# Patient Record
Sex: Male | Born: 1940
Health system: Southern US, Community
[De-identification: ages and names within clinical notes are randomized; demographics above are authoritative.]

## PROBLEM LIST (undated history)

## (undated) DIAGNOSIS — E7800 Pure hypercholesterolemia, unspecified: Secondary | ICD-10-CM

## (undated) DIAGNOSIS — K219 Gastro-esophageal reflux disease without esophagitis: Secondary | ICD-10-CM

## (undated) DIAGNOSIS — E079 Disorder of thyroid, unspecified: Secondary | ICD-10-CM

## (undated) DIAGNOSIS — N200 Calculus of kidney: Secondary | ICD-10-CM

## (undated) DIAGNOSIS — E039 Hypothyroidism, unspecified: Secondary | ICD-10-CM

## (undated) DIAGNOSIS — I2510 Atherosclerotic heart disease of native coronary artery without angina pectoris: Secondary | ICD-10-CM

## (undated) DIAGNOSIS — I219 Acute myocardial infarction, unspecified: Secondary | ICD-10-CM

## (undated) DIAGNOSIS — N400 Enlarged prostate without lower urinary tract symptoms: Secondary | ICD-10-CM

## (undated) HISTORY — PX: HERNIA REPAIR: SHX51

---

## 2016-07-26 ENCOUNTER — Emergency Department
Admission: EM | Admit: 2016-07-26 | Discharge: 2016-07-27 | Disposition: A | Discharge status: Other Acute Inpatient Hospital | Payer: Medicare PPO | Attending: Emergency Medicine | Admitting: Emergency Medicine

## 2016-07-26 DIAGNOSIS — F600 Paranoid personality disorder: Secondary | ICD-10-CM | POA: Insufficient documentation

## 2016-07-26 DIAGNOSIS — F22 Delusional disorders: Secondary | ICD-10-CM

## 2016-07-26 LAB — URINE DRUG SCREEN, QUALITATIVE (ARMC ONLY)
AMPHETAMINES, UR SCREEN: NOT DETECTED
BENZODIAZEPINE, UR SCRN: NOT DETECTED
Barbiturates, Ur Screen: NOT DETECTED
COCAINE METABOLITE, UR ~~LOC~~: NOT DETECTED
Cannabinoid 50 Ng, Ur ~~LOC~~: NOT DETECTED
MDMA (ECSTASY) UR SCREEN: NOT DETECTED
Methadone Scn, Ur: NOT DETECTED
OPIATE, UR SCREEN: NOT DETECTED
PHENCYCLIDINE (PCP) UR S: NOT DETECTED
Tricyclic, Ur Screen: POSITIVE — AB

## 2016-07-26 LAB — URINALYSIS, COMPLETE (UACMP) WITH MICROSCOPIC
Bacteria, UA: NONE SEEN
Bilirubin Urine: NEGATIVE
Glucose, UA: NEGATIVE mg/dL
KETONES UR: NEGATIVE mg/dL
Nitrite: NEGATIVE
PH: 6 (ref 5.0–8.0)
Protein, ur: NEGATIVE mg/dL
Specific Gravity, Urine: 1.009 (ref 1.005–1.030)

## 2016-07-26 LAB — COMPREHENSIVE METABOLIC PANEL
ALT: 15 U/L — ABNORMAL LOW (ref 17–63)
AST: 23 U/L (ref 15–41)
Albumin: 3.8 g/dL (ref 3.5–5.0)
Alkaline Phosphatase: 36 U/L — ABNORMAL LOW (ref 38–126)
Anion gap: 5 (ref 5–15)
BUN: 19 mg/dL (ref 6–20)
CHLORIDE: 108 mmol/L (ref 101–111)
CO2: 24 mmol/L (ref 22–32)
CREATININE: 1.25 mg/dL — AB (ref 0.61–1.24)
Calcium: 8.8 mg/dL — ABNORMAL LOW (ref 8.9–10.3)
GFR, EST NON AFRICAN AMERICAN: 55 mL/min — AB (ref >60)
Glucose, Bld: 98 mg/dL (ref 65–99)
POTASSIUM: 3.9 mmol/L (ref 3.5–5.1)
Sodium: 137 mmol/L (ref 135–145)
Total Bilirubin: 0.6 mg/dL (ref 0.3–1.2)
Total Protein: 7.2 g/dL (ref 6.5–8.1)

## 2016-07-26 LAB — CBC
HCT: 44.3 % (ref 40.0–52.0)
Hemoglobin: 15 g/dL (ref 13.0–18.0)
MCH: 30.8 pg (ref 26.0–34.0)
MCHC: 33.9 g/dL (ref 32.0–36.0)
MCV: 90.6 fL (ref 80.0–100.0)
PLATELETS: 212 10*3/uL (ref 150–440)
RBC: 4.88 MIL/uL (ref 4.40–5.90)
RDW: 13.8 % (ref 11.5–14.5)
WBC: 7.4 10*3/uL (ref 3.8–10.6)

## 2016-07-26 LAB — TSH: TSH: 5.352 u[IU]/mL — ABNORMAL HIGH (ref 0.350–4.500)

## 2016-07-26 LAB — ETHANOL

## 2016-07-26 LAB — T4, FREE: Free T4: 1.02 ng/dL (ref 0.61–1.12)

## 2016-07-26 MED ORDER — FINASTERIDE 5 MG PO TABS
5.0000 mg | ORAL_TABLET | Freq: Every day | ORAL | Status: DC
  Administered 2016-07-26 – 2016-07-27 (×2): 5 mg via ORAL
  Filled 2016-07-26 (×2): qty 1

## 2016-07-26 MED ORDER — ATORVASTATIN CALCIUM 20 MG PO TABS
40.0000 mg | ORAL_TABLET | Freq: Every day | ORAL | Status: DC
  Administered 2016-07-26: 40 mg via ORAL
  Filled 2016-07-26: qty 2

## 2016-07-26 MED ORDER — RISPERIDONE 1 MG PO TABS
2.0000 mg | ORAL_TABLET | Freq: Every day | ORAL | Status: DC
  Administered 2016-07-26: 2 mg via ORAL
  Filled 2016-07-26: qty 2

## 2016-07-26 MED ORDER — QUETIAPINE FUMARATE 25 MG PO TABS
ORAL_TABLET | ORAL | Status: AC
  Filled 2016-07-26: qty 2

## 2016-07-26 MED ORDER — CLONAZEPAM 0.5 MG PO TABS
0.5000 mg | ORAL_TABLET | Freq: Once | ORAL | Status: AC
  Administered 2016-07-26: 0.5 mg via ORAL

## 2016-07-26 MED ORDER — CLONAZEPAM 0.5 MG PO TABS: 0.5000 mg | ORAL_TABLET | Freq: Every day | ORAL | Status: DC

## 2016-07-26 MED ORDER — DIVALPROEX SODIUM 500 MG PO DR TAB
500.0000 mg | DELAYED_RELEASE_TABLET | Freq: Two times a day (BID) | ORAL | Status: DC
  Administered 2016-07-26 – 2016-07-27 (×3): 500 mg via ORAL
  Filled 2016-07-26: qty 2
  Filled 2016-07-26 (×2): qty 1

## 2016-07-26 MED ORDER — PANTOPRAZOLE SODIUM 40 MG PO TBEC
40.0000 mg | DELAYED_RELEASE_TABLET | Freq: Every day | ORAL | Status: DC
  Administered 2016-07-26 – 2016-07-27 (×2): 40 mg via ORAL
  Filled 2016-07-26 (×2): qty 1

## 2016-07-26 MED ORDER — QUETIAPINE FUMARATE 25 MG PO TABS: 50.0000 mg | ORAL_TABLET | Freq: Every day | ORAL | Status: DC

## 2016-07-26 MED ORDER — RISPERIDONE 1 MG PO TABS
1.0000 mg | ORAL_TABLET | Freq: Every day | ORAL | Status: DC
  Administered 2016-07-26 – 2016-07-27 (×2): 1 mg via ORAL
  Filled 2016-07-26 (×2): qty 1

## 2016-07-26 MED ORDER — ATORVASTATIN CALCIUM 20 MG PO TABS
ORAL_TABLET | ORAL | Status: AC
  Administered 2016-07-26: 40 mg via ORAL
  Filled 2016-07-26: qty 2

## 2016-07-26 MED ORDER — CLONAZEPAM 0.5 MG PO TABS
ORAL_TABLET | ORAL | Status: AC
  Filled 2016-07-26: qty 1

## 2016-07-26 MED ORDER — LEVOTHYROXINE SODIUM 112 MCG PO TABS
112.0000 ug | ORAL_TABLET | Freq: Every day | ORAL | Status: DC
  Administered 2016-07-26 – 2016-07-27 (×2): 112 ug via ORAL
  Filled 2016-07-26 (×2): qty 1

## 2016-07-26 MED ORDER — ZIPRASIDONE MESYLATE 20 MG IM SOLR
10.0000 mg | Freq: Three times a day (TID) | INTRAMUSCULAR | Status: DC | PRN
  Administered 2016-07-26: 10 mg via INTRAMUSCULAR
  Filled 2016-07-26: qty 20

## 2016-07-26 MED ORDER — CLONAZEPAM 0.5 MG PO TABS
0.5000 mg | ORAL_TABLET | Freq: Two times a day (BID) | ORAL | Status: DC | PRN
  Administered 2016-07-27 (×2): 0.5 mg via ORAL
  Filled 2016-07-26 (×2): qty 1

## 2016-07-26 NOTE — ED Notes (Addendum)
Patient in room laying down ,but no asleep

## 2016-07-26 NOTE — ED Notes (Signed)
Patient in bathroom

## 2016-07-26 NOTE — ED Notes (Signed)

## 2016-07-26 NOTE — ED Triage Notes (Signed)
Per daughter patient has had odd behavior and paranoia since last week. Hx of the same last year and was admitted to psychiatric facility for the same. Does have a hx of bipolar disorder, also has not slept in days.

## 2016-07-26 NOTE — ED Notes (Signed)
Breakfast was given to patient. 

## 2016-07-26 NOTE — ED Notes (Signed)
Referral information for Psychiatric Hospitalization faxed to;     [facility] [phone]),    [facility] [phone]),    [facility] [phone])   [facility] [phone]),    [facility] [phone] or [phone]),    [facility] [phone]),    [facility] [phone]).

## 2016-07-26 NOTE — ED Notes (Signed)
Contact daughter Vedia Pereyra at cell [phone], work phone [phone] (work u for update or questions. Vedia Pereyra husband Trudee Grip at [phone] Ex-wife Marshia Ly [phone]
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
## 2016-07-26 NOTE — ED Notes (Signed)
BEHAVIORAL HEALTH ROUNDING Patient sleeping: No. Patient alert and oriented: yes Behavior appropriate:  - he has been up and down from the bed - back and forth to the BR - he cannot seem to settle down and relax .  ; If no, describe:  Nutrition and fluids offered: yes Toileting and hygiene offered: Yes  Sitter present: q15 minute observations and security  monitoring Law enforcement present: Yes  ODS

## 2016-07-26 NOTE — ED Notes (Signed)
Patient walking around in room.

## 2016-07-26 NOTE — ED Notes (Signed)
BEHAVIORAL HEALTH ROUNDING Patient sleeping: No. Patient alert and oriented: yes Behavior appropriate: Yes.  ; If no, describe:  Nutrition and fluids offered: yes Toileting and hygiene offered: Yes  Sitter present: q15 minute observations and security  monitoring Law enforcement present: Yes  ODS  

## 2016-07-26 NOTE — ED Notes (Signed)
Pt given meal tray and juice. 

## 2016-07-26 NOTE — ED Notes (Signed)
PO intake encouraged - breakfast provided along with an extra juice and 249ml water

## 2016-07-26 NOTE — ED Notes (Signed)
Patient in watching tv

## 2016-07-26 NOTE — ED Notes (Signed)
SOC is currently being performed
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
## 2016-07-26 NOTE — ED Notes (Signed)
Patient up walking to bathroom with nurse Amy T.

## 2016-07-26 NOTE — ED Notes (Signed)
BEHAVIORAL HEALTH ROUNDING Patient sleeping: No. Patient alert and oriented: yes Behavior appropriate:   ; If no, describe:  Nutrition and fluids offered: yes Toileting and hygiene offered: Yes  Sitter present: q15 minute observations and security monitoring Law enforcement present: Yes  ODS

## 2016-07-26 NOTE — ED Notes (Signed)
Patient in bathroom at this time.

## 2016-07-26 NOTE — ED Notes (Signed)
Patient has been accepted to [facility]  Patient assigned to room - [facility] - To be determined at admissions on arrival Accepting physician is Dr. Jonelle Sports.  Call report to [phone].  Representative was Jocelyn Lamer.  ER Staff is aware of it [facility] ER Sect.; Dr. Joni Fears, ER MD & [place] [facility] Patient's Nurse)     Patient can arrive on 07/27/2016 after 11:00 am

## 2016-07-26 NOTE — ED Notes (Signed)
BEHAVIORAL HEALTH ROUNDING Patient sleeping: Yes.   Patient alert and oriented: eyes closed  Appears to be asleep Behavior appropriate: Yes.  ; If no, describe:  Nutrition and fluids offered: Yes  Toileting and hygiene offered: sleeping Sitter present: q 15 minute observations and security monitoring Law enforcement present: yes  ODS 

## 2016-07-27 NOTE — ED Notes (Signed)
Called for transport by [facility]  [phone]

## 2016-07-27 NOTE — ED Notes (Signed)
Assessment completed  - am meds to be administered as ordered  - I informed him of his pending transfer to [facility] - offered him the phone so that he may contact his family if he wished  - he declined at this time   I reassured him that I will keep him informed about his care    Continue to monitor

## 2016-07-27 NOTE — ED Notes (Signed)
PT sleeping at this time, pt lying on left side, audible snoring can be heard, pt in NAD at this time.

## 2016-07-27 NOTE — ED Provider Notes (Signed)
-----------------------------------------   11:04 AM on 07/27/2016 -----------------------------------------   Blood pressure 107/85, pulse 69, temperature 97.6 F (36.4 C), temperature source Oral, resp. rate 18, SpO2 96 %.  The patient had no acute events since last update.  Calm and cooperative at this time.  Disposition is pending Psychiatry/Behavioral Medicine team recommendations.     Orbie Pyo, MD 07/27/16 [phone]

## 2016-07-27 NOTE — ED Notes (Addendum)
Pt ambulated to BR without difficulty noted, pt returned to room and laid back in bed without distress.

## 2016-07-27 NOTE — ED Notes (Signed)
He is transferring with [employer] officer at this time for an arranged inpt admission to [facility]

## 2016-07-27 NOTE — ED Notes (Signed)

## 2016-07-27 NOTE — ED Notes (Signed)
Pt sleeping at this time, equal and unlabored rise and fall of chest noted.

## 2016-07-27 NOTE — ED Notes (Signed)
BEHAVIORAL HEALTH ROUNDING Patient sleeping: No. Patient alert and oriented: yes Behavior appropriate: Yes.  - talkative ; If no, describe:  Nutrition and fluids offered: yes Toileting and hygiene offered: Yes  Sitter present: q15 minute observations and security monitoring Law enforcement present: Yes  ODS

## 2016-07-27 NOTE — ED Notes (Signed)
Pt states to this RN he is having difficulty sleeping, pt notified that this RN will check with PRN orders for sleeping difficulty. Pt verbalized understanding of this.

## 2016-07-27 NOTE — ED Notes (Signed)
Pt lying on left side sleeping, audible snoring can be heard at this time.

## 2016-07-27 NOTE — ED Notes (Signed)
Pt observed with no unusual behavior  Appropriate to stimulation  No verbalized needs or concerns at this time  NAD assessed  He has ambulated to and from the BR during report  Continue to monitor

## 2016-07-27 NOTE — ED Notes (Signed)
Am meds administered as ordered.

## 2016-07-27 NOTE — ED Provider Notes (Signed)
-----------------------------------------   6:41 AM on 07/27/2016 -----------------------------------------   Blood pressure 130/78, pulse 70, temperature 97.9 F (36.6 C), temperature source Axillary, resp. rate 20, SpO2 96 %.  The patient had no acute events since last update.  Sleeping at this time.  Looks like patient was accepted to [facility] and will be transported after 11 AM this morning.   Paulette Blanch, MD 07/27/16 [phone]

## 2016-07-27 NOTE — ED Notes (Signed)
ED  Is the patient under IVC or is there intent for IVC: Yes.   Is the patient medically cleared: Yes.   Is there vacancy in the ED BHU: Yes.   Is the population mix appropriate for patient: Yes.   Is the patient awaiting placement in inpatient or outpatient setting:  Accepted to [facility]  Has the patient had a psychiatric consult: Yes.  SOC   Survey of unit performed for contraband, proper placement and condition of furniture, tampering with fixtures in bathroom, shower, and each patient room: Yes.  ; Findings:  APPEARANCE/BEHAVIOR Calm and cooperative NEURO ASSESSMENT Orientation: oriented x3   Hallucinations: No.None noted (Hallucinations) Speech: Normal Gait: normal RESPIRATORY ASSESSMENT Even  Unlabored respirations  CARDIOVASCULAR ASSESSMENT Pulses equal   regular rate  Skin warm and dry   GASTROINTESTINAL ASSESSMENT no GI complaint EXTREMITIES Full ROM  PLAN OF CARE Provide calm/safe environment. Vital signs assessed twice daily. ED BHU Assessment once each 12-hour shift. Collaborate with TTS daily or as condition indicates. Assure the ED provider has rounded once each shift. Provide and encourage hygiene. Provide redirection as needed. Assess for escalating behavior; address immediately and inform ED provider.  Assess family dynamic and appropriateness for visitation as needed: Yes.  ; If necessary, describe findings:  Educate the patient/family about BHU procedures/visitation: Yes.  ; If necessary, describe findings:

## 2016-07-30 DIAGNOSIS — F3110 Bipolar disorder, current episode manic without psychotic features, unspecified: Secondary | ICD-10-CM | POA: Diagnosis not present

## 2016-07-31 DIAGNOSIS — F3110 Bipolar disorder, current episode manic without psychotic features, unspecified: Secondary | ICD-10-CM | POA: Diagnosis not present

## 2016-08-01 DIAGNOSIS — F3110 Bipolar disorder, current episode manic without psychotic features, unspecified: Secondary | ICD-10-CM | POA: Diagnosis not present

## 2016-08-02 DIAGNOSIS — F3110 Bipolar disorder, current episode manic without psychotic features, unspecified: Secondary | ICD-10-CM | POA: Diagnosis not present

## 2016-08-03 DIAGNOSIS — F3110 Bipolar disorder, current episode manic without psychotic features, unspecified: Secondary | ICD-10-CM | POA: Diagnosis not present

## 2016-08-27 DIAGNOSIS — F39 Unspecified mood [affective] disorder: Secondary | ICD-10-CM | POA: Diagnosis not present

## 2016-08-27 DIAGNOSIS — F419 Anxiety disorder, unspecified: Secondary | ICD-10-CM | POA: Diagnosis not present

## 2016-09-24 DIAGNOSIS — F419 Anxiety disorder, unspecified: Secondary | ICD-10-CM | POA: Diagnosis not present

## 2016-09-29 DIAGNOSIS — I219 Acute myocardial infarction, unspecified: Secondary | ICD-10-CM

## 2016-09-29 HISTORY — DX: Acute myocardial infarction, unspecified: I21.9

## 2016-10-10 ENCOUNTER — Emergency Department: Payer: Medicare HMO

## 2016-10-10 ENCOUNTER — Inpatient Hospital Stay
Admission: EM | Admit: 2016-10-10 | Discharge: 2016-10-13 | DRG: 247 | Disposition: A | Discharge status: Home / Self Care | Payer: Medicare HMO | Attending: Internal Medicine | Admitting: Internal Medicine

## 2016-10-10 DIAGNOSIS — R110 Nausea: Secondary | ICD-10-CM | POA: Diagnosis not present

## 2016-10-10 DIAGNOSIS — I214 Non-ST elevation (NSTEMI) myocardial infarction: Principal | ICD-10-CM | POA: Diagnosis present

## 2016-10-10 DIAGNOSIS — R008 Other abnormalities of heart beat: Secondary | ICD-10-CM | POA: Diagnosis present

## 2016-10-10 DIAGNOSIS — Z79899 Other long term (current) drug therapy: Secondary | ICD-10-CM | POA: Diagnosis not present

## 2016-10-10 DIAGNOSIS — N182 Chronic kidney disease, stage 2 (mild): Secondary | ICD-10-CM | POA: Diagnosis not present

## 2016-10-10 DIAGNOSIS — Z955 Presence of coronary angioplasty implant and graft: Secondary | ICD-10-CM | POA: Diagnosis not present

## 2016-10-10 DIAGNOSIS — E0781 Sick-euthyroid syndrome: Secondary | ICD-10-CM | POA: Diagnosis present

## 2016-10-10 DIAGNOSIS — R748 Abnormal levels of other serum enzymes: Secondary | ICD-10-CM | POA: Diagnosis not present

## 2016-10-10 DIAGNOSIS — E039 Hypothyroidism, unspecified: Secondary | ICD-10-CM | POA: Diagnosis present

## 2016-10-10 DIAGNOSIS — N400 Enlarged prostate without lower urinary tract symptoms: Secondary | ICD-10-CM | POA: Diagnosis present

## 2016-10-10 DIAGNOSIS — N179 Acute kidney failure, unspecified: Secondary | ICD-10-CM | POA: Diagnosis present

## 2016-10-10 DIAGNOSIS — I6523 Occlusion and stenosis of bilateral carotid arteries: Secondary | ICD-10-CM | POA: Diagnosis not present

## 2016-10-10 DIAGNOSIS — I499 Cardiac arrhythmia, unspecified: Secondary | ICD-10-CM | POA: Diagnosis not present

## 2016-10-10 DIAGNOSIS — I248 Other forms of acute ischemic heart disease: Secondary | ICD-10-CM | POA: Diagnosis present

## 2016-10-10 DIAGNOSIS — I493 Ventricular premature depolarization: Secondary | ICD-10-CM | POA: Diagnosis present

## 2016-10-10 DIAGNOSIS — I4510 Unspecified right bundle-branch block: Secondary | ICD-10-CM | POA: Diagnosis present

## 2016-10-10 DIAGNOSIS — R0902 Hypoxemia: Secondary | ICD-10-CM | POA: Diagnosis present

## 2016-10-10 DIAGNOSIS — I5030 Unspecified diastolic (congestive) heart failure: Secondary | ICD-10-CM | POA: Diagnosis not present

## 2016-10-10 DIAGNOSIS — R55 Syncope and collapse: Secondary | ICD-10-CM | POA: Diagnosis present

## 2016-10-10 DIAGNOSIS — Z881 Allergy status to other antibiotic agents status: Secondary | ICD-10-CM

## 2016-10-10 DIAGNOSIS — E785 Hyperlipidemia, unspecified: Secondary | ICD-10-CM | POA: Diagnosis present

## 2016-10-10 DIAGNOSIS — I498 Other specified cardiac arrhythmias: Secondary | ICD-10-CM

## 2016-10-10 DIAGNOSIS — K219 Gastro-esophageal reflux disease without esophagitis: Secondary | ICD-10-CM | POA: Diagnosis present

## 2016-10-10 DIAGNOSIS — R42 Dizziness and giddiness: Secondary | ICD-10-CM | POA: Diagnosis not present

## 2016-10-10 DIAGNOSIS — R079 Chest pain, unspecified: Secondary | ICD-10-CM | POA: Diagnosis not present

## 2016-10-10 DIAGNOSIS — Z9861 Coronary angioplasty status: Secondary | ICD-10-CM | POA: Diagnosis not present

## 2016-10-10 HISTORY — DX: Disorder of thyroid, unspecified: E07.9

## 2016-10-10 HISTORY — DX: Pure hypercholesterolemia, unspecified: E78.00

## 2016-10-10 LAB — COMPREHENSIVE METABOLIC PANEL
ALT: 14 U/L — ABNORMAL LOW (ref 17–63)
AST: 21 U/L (ref 15–41)
Albumin: 3.8 g/dL (ref 3.5–5.0)
Alkaline Phosphatase: 45 U/L (ref 38–126)
Anion gap: 10 (ref 5–15)
BUN: 16 mg/dL (ref 6–20)
CO2: 24 mmol/L (ref 22–32)
Calcium: 8.8 mg/dL — ABNORMAL LOW (ref 8.9–10.3)
Chloride: 106 mmol/L (ref 101–111)
Creatinine, Ser: 1.53 mg/dL — ABNORMAL HIGH (ref 0.61–1.24)
GFR calc Af Amer: 49 mL/min — ABNORMAL LOW (ref >60)
GFR calc non Af Amer: 42 mL/min — ABNORMAL LOW (ref >60)
Glucose, Bld: 138 mg/dL — ABNORMAL HIGH (ref 65–99)
Potassium: 4 mmol/L (ref 3.5–5.1)
Sodium: 140 mmol/L (ref 135–145)
Total Bilirubin: 1.1 mg/dL (ref 0.3–1.2)
Total Protein: 7.2 g/dL (ref 6.5–8.1)

## 2016-10-10 LAB — CBC
HEMATOCRIT: 45 % (ref 40.0–52.0)
Hemoglobin: 15.1 g/dL (ref 13.0–18.0)
MCH: 30.9 pg (ref 26.0–34.0)
MCHC: 33.5 g/dL (ref 32.0–36.0)
MCV: 92.3 fL (ref 80.0–100.0)
Platelets: 109 10*3/uL — ABNORMAL LOW (ref 150–440)
RBC: 4.88 MIL/uL (ref 4.40–5.90)
RDW: 13.9 % (ref 11.5–14.5)
WBC: 8.5 10*3/uL (ref 3.8–10.6)

## 2016-10-10 LAB — TROPONIN I: Troponin I: 0.04 ng/mL (ref <0.03)

## 2016-10-10 MED ORDER — FINASTERIDE 5 MG PO TABS
5.0000 mg | ORAL_TABLET | Freq: Every day | ORAL | Status: DC
Start: 2016-10-11 — End: 2016-10-13
  Administered 2016-10-11 – 2016-10-13 (×2): 5 mg via ORAL
  Filled 2016-10-10 (×2): qty 1

## 2016-10-10 MED ORDER — ONDANSETRON HCL 4 MG/2ML IJ SOLN: 4.0000 mg | Freq: Four times a day (QID) | INTRAMUSCULAR | Status: DC | PRN

## 2016-10-10 MED ORDER — ATORVASTATIN CALCIUM 20 MG PO TABS
40.0000 mg | ORAL_TABLET | Freq: Every day | ORAL | Status: DC
  Administered 2016-10-11 – 2016-10-12 (×2): 40 mg via ORAL
  Filled 2016-10-10 (×2): qty 2

## 2016-10-10 MED ORDER — ENOXAPARIN SODIUM 40 MG/0.4ML ~~LOC~~ SOLN
40.0000 mg | SUBCUTANEOUS | Status: DC
  Administered 2016-10-11: 40 mg via SUBCUTANEOUS
  Filled 2016-10-10: qty 0.4

## 2016-10-10 MED ORDER — PANTOPRAZOLE SODIUM 40 MG PO TBEC
40.0000 mg | DELAYED_RELEASE_TABLET | Freq: Every day | ORAL | Status: DC
  Administered 2016-10-11 – 2016-10-13 (×3): 40 mg via ORAL
  Filled 2016-10-10 (×3): qty 1

## 2016-10-10 MED ORDER — ZOLPIDEM TARTRATE 5 MG PO TABS: 5.0000 mg | ORAL_TABLET | Freq: Every evening | ORAL | Status: DC | PRN

## 2016-10-10 MED ORDER — BISACODYL 5 MG PO TBEC: 5.0000 mg | DELAYED_RELEASE_TABLET | Freq: Every day | ORAL | Status: DC | PRN

## 2016-10-10 MED ORDER — SENNOSIDES-DOCUSATE SODIUM 8.6-50 MG PO TABS
1.0000 | ORAL_TABLET | Freq: Every evening | ORAL | Status: DC | PRN
  Administered 2016-10-12: 1 via ORAL
  Filled 2016-10-10: qty 1

## 2016-10-10 MED ORDER — SODIUM CHLORIDE 0.9% FLUSH
3.0000 mL | Freq: Two times a day (BID) | INTRAVENOUS | Status: DC
  Administered 2016-10-11 – 2016-10-13 (×5): 3 mL via INTRAVENOUS

## 2016-10-10 MED ORDER — QUETIAPINE FUMARATE 25 MG PO TABS
50.0000 mg | ORAL_TABLET | Freq: Every day | ORAL | Status: DC
  Administered 2016-10-11 – 2016-10-12 (×3): 50 mg via ORAL
  Filled 2016-10-10 (×3): qty 2

## 2016-10-10 MED ORDER — LEVOTHYROXINE SODIUM 112 MCG PO TABS
112.0000 ug | ORAL_TABLET | Freq: Every day | ORAL | Status: DC
  Administered 2016-10-11 – 2016-10-13 (×2): 112 ug via ORAL
  Filled 2016-10-10 (×3): qty 1

## 2016-10-10 MED ORDER — ALUM & MAG HYDROXIDE-SIMETH 200-200-20 MG/5ML PO SUSP: 30.0000 mL | Freq: Four times a day (QID) | ORAL | Status: DC | PRN

## 2016-10-10 MED ORDER — IPRATROPIUM-ALBUTEROL 0.5-2.5 (3) MG/3ML IN SOLN: 3.0000 mL | Freq: Four times a day (QID) | RESPIRATORY_TRACT | Status: DC | PRN

## 2016-10-10 MED ORDER — MAGNESIUM CITRATE PO SOLN
1.0000 | Freq: Once | ORAL | Status: DC | PRN
  Filled 2016-10-10: qty 296

## 2016-10-10 MED ORDER — ONDANSETRON HCL 4 MG PO TABS: 4.0000 mg | ORAL_TABLET | Freq: Four times a day (QID) | ORAL | Status: DC | PRN

## 2016-10-10 MED ORDER — SODIUM CHLORIDE 0.9 % IV SOLN
INTRAVENOUS | Status: DC
  Administered 2016-10-11 (×2): via INTRAVENOUS

## 2016-10-10 MED ORDER — HYDROCODONE-ACETAMINOPHEN 5-325 MG PO TABS: 1.0000 | ORAL_TABLET | ORAL | Status: DC | PRN

## 2016-10-10 MED ORDER — SODIUM CHLORIDE 0.9 % IV BOLUS (SEPSIS)
1000.0000 mL | Freq: Once | INTRAVENOUS | Status: AC
  Administered 2016-10-10: 1000 mL via INTRAVENOUS

## 2016-10-10 MED ORDER — ASPIRIN 81 MG PO CHEW
324.0000 mg | CHEWABLE_TABLET | Freq: Once | ORAL | Status: AC
  Administered 2016-10-10: 324 mg via ORAL
  Filled 2016-10-10: qty 4

## 2016-10-10 MED ORDER — ACETAMINOPHEN 650 MG RE SUPP: 650.0000 mg | Freq: Four times a day (QID) | RECTAL | Status: DC | PRN

## 2016-10-10 MED ORDER — ACETAMINOPHEN 325 MG PO TABS: 650.0000 mg | ORAL_TABLET | Freq: Four times a day (QID) | ORAL | Status: DC | PRN

## 2016-10-10 NOTE — ED Triage Notes (Signed)
Pt brought in via ems for c/o chest pain and nausea - pt states 45 min ago he started with nausea/weakness/and syncopal episode

## 2016-10-10 NOTE — ED Notes (Signed)
Pt cont. To deny chest pain, shortness of breath, or nausea - color remains pale but at this time skin is warm and dry - cap refill is less than 3 sec.

## 2016-10-10 NOTE — ED Notes (Addendum)
Pt placed on 2L O2 via n/c for desat to 89% - pt is pale/gray in color - pt appears to be in ventricular rhythm with PVC's every 4-5 beats - pt cont. To deny chest pain or shortness of breath

## 2016-10-10 NOTE — H&P (Signed)
History and Physical   [facility] - [place] @ [facility] Admission History and Physical [employer], D.O.    Patient Name: Christopher Carlson MR#: [id number] Date of Birth: 1940-03-30 Date of Admission: 10/10/2016  Primary Care Physician: Maryland Pink, MD  Chief Complaint:  Chief Complaint  Patient presents with  . Chest Pain  . Nausea    HPI: Christopher Carlson is a 76 y.o. male with a known history of hyperlipidemia, hypothyroidism presents to the emergency department for evaluation of syncope.  Patient was in a usual state of health until this evening when he had sustained a brief loss of consciousness preceded by nausea and diaphoresis. He regained consciousness spontaneously and complains of weakness, dizziness and fatigue..  Of note patient states that about five months ago his PMD halved his Synthroid. Patient denies fevers/chills, chest pain, shortness of breath, N/V/C/D, abdominal pain, dysuria/frequency, changes in mental status.    Otherwise there has been no change in status. Patient has been taking medication as prescribed and there has been no recent change in medication or diet.  No recent antibiotics.  There has been no recent illness, hospitalizations, travel or sick contacts.    EMS/ED Course: in the emergency department patient was found to be hypoxic with O2 sat of 89%,Patient received aspirin, fluids. Medical admission has been requested for further management of syncope, ventricular arrhythmia consistent with bigeminy.  Review of Systems:  CONSTITUTIONAL: positive fatigue and weakness.No fever/chills, weight gain/loss, headache. EYES: No blurry or double vision. ENT: No tinnitus, postnasal drip, redness or soreness of the oropharynx. RESPIRATORY: No cough, dyspnea, wheeze.  No hemoptysis.  CARDIOVASCULAR: No chest pain, palpitations, syncope, orthopnea. No lower extremity edema.  GASTROINTESTINAL: positive nausea.No vomiting, abdominal pain, diarrhea,  constipation.  No hematemesis, melena or hematochezia. GENITOURINARY: No dysuria, frequency, hematuria. ENDOCRINE: No polyuria or nocturia. No heat or cold intolerance. HEMATOLOGY: No anemia, bruising, bleeding. INTEGUMENTARY: No rashes, ulcers, lesions. MUSCULOSKELETAL: No arthritis, gout, dyspnea. NEUROLOGIC: positive brief loss of consciousness.No numbness, tingling, ataxia, seizure-type activity, weakness. PSYCHIATRIC: No anxiety, depression, insomnia.   Past Medical History:  Diagnosis Date  . High cholesterol   . Thyroid disease     Past Surgical History:  Procedure Laterality Date  . HERNIA REPAIR       reports that he has never smoked. He has never used smokeless tobacco. He reports that he does not drink alcohol or use drugs.  Allergies  Allergen Reactions  . Ciprofloxacin Rash    Family History   Medical History Relation Name Comments  Hyperlipidemia (Elevated cholesterol) Brother    Alcohol abuse Father    Lung cancer Father    Hyperlipidemia (Elevated cholesterol) Sister    Thyroid disease Sister       Prior to Admission medications   Medication Sig Start Date End Date Taking? Authorizing Provider  atorvastatin (LIPITOR) 40 MG tablet Take 40 mg by mouth daily. 07/03/16  Yes [provider]  clonazePAM (KLONOPIN) 1 MG tablet Take 0.5 mg by mouth at bedtime.  09/10/16  Yes [provider]  finasteride (PROSCAR) 5 MG tablet Take 5 mg by mouth daily.   Yes [provider]  levothyroxine (SYNTHROID, LEVOTHROID) 112 MCG tablet Take 112 mcg by mouth daily. 07/03/16  Yes [provider]  pantoprazole (PROTONIX) 40 MG tablet Take 40 mg by mouth daily. 07/03/16  Yes [provider]  QUEtiapine (SEROQUEL) 50 MG tablet Take 50 mg by mouth at bedtime. 07/03/16  Yes [provider]  Physical Exam: Vitals:   10/10/16 2030 10/10/16 2100 10/10/16 2130 10/10/16 2200  BP: 123/77 113/74 124/75 120/84  Pulse: (!) 43  88 80 84  Resp: (!) 23 16 (!) 23 (!) 21  Temp:      TempSrc:      SpO2: 95% 96% 94% 94%  Weight:      Height:        GENERAL: 76 y.o.-year-old male patient, well-developed, well-nourished lying in the bed in no acute distress.  Appears anxious.  HEENT: Head atraumatic, normocephalic. Pupils equal. Mucus membranes moist. NECK: Supple, full range of motion. No JVD, no bruit heard. No thyroid enlargement, no tenderness, no cervical lymphadenopathy. CHEST: Normal breath sounds bilaterally. No wheezing, rales, rhonchi or crackles. No use of accessory muscles of respiration.  No reproducible chest wall tenderness.  CARDIOVASCULAR: irregular.S1, S2 normal. No murmurs, rubs, or gallops. Cap refill <2 seconds. Pulses intact distally.  ABDOMEN: Soft, nondistended, nontender. No rebound, guarding, rigidity. Normoactive bowel sounds present in all four quadrants.  EXTREMITIES: No pedal edema, cyanosis, or clubbing. No calf tenderness or Homan's sign.  NEUROLOGIC: The patient is alert and oriented x 3. Cranial nerves II through XII are grossly intact with no focal sensorimotor deficit. PSYCHIATRIC:  Normal affect, mood, thought content. SKIN: Warm, dry, and intact without obvious rash, lesion, or ulcer.    Labs on Admission:  CBC:  Recent Labs Lab 10/10/16 1930  WBC 8.5  HGB 15.1  HCT 45.0  MCV 92.3  PLT 017*   Basic Metabolic Panel:  Recent Labs Lab 10/10/16 1930  NA 140  K 4.0  CL 106  CO2 24  GLUCOSE 138*  BUN 16  CREATININE 1.53*  CALCIUM 8.8*   GFR: Estimated Creatinine Clearance: 45.1 mL/min (A) (by C-G formula based on SCr of 1.53 mg/dL (H)). Liver Function Tests:  Recent Labs Lab 10/10/16 1930  AST 21  ALT 14*  ALKPHOS 45  BILITOT 1.1  PROT 7.2  ALBUMIN 3.8   No results for input(s): LIPASE, AMYLASE in the last 168 hours. No results for input(s): AMMONIA in the last 168 hours. Coagulation Profile: No results for input(s): INR, PROTIME in the last 168  hours. Cardiac Enzymes:  Recent Labs Lab 10/10/16 1930  TROPONINI 0.04*   BNP (last 3 results) No results for input(s): PROBNP in the last 8760 hours. HbA1C: No results for input(s): HGBA1C in the last 72 hours. CBG: No results for input(s): GLUCAP in the last 168 hours. Lipid Profile: No results for input(s): CHOL, HDL, LDLCALC, TRIG, CHOLHDL, LDLDIRECT in the last 72 hours. Thyroid Function Tests: No results for input(s): TSH, T4TOTAL, FREET4, T3FREE, THYROIDAB in the last 72 hours. Anemia Panel: No results for input(s): VITAMINB12, FOLATE, FERRITIN, TIBC, IRON, RETICCTPCT in the last 72 hours. Urine analysis:    Component Value Date/Time   COLORURINE YELLOW (A) 07/26/2016 0736   APPEARANCEUR CLEAR (A) 07/26/2016 0736   LABSPEC 1.009 07/26/2016 0736   PHURINE 6.0 07/26/2016 0736   GLUCOSEU NEGATIVE 07/26/2016 0736   HGBUR LARGE (A) 07/26/2016 0736   BILIRUBINUR NEGATIVE 07/26/2016 0736   KETONESUR NEGATIVE 07/26/2016 0736   PROTEINUR NEGATIVE 07/26/2016 0736   NITRITE NEGATIVE 07/26/2016 0736   LEUKOCYTESUR TRACE (A) 07/26/2016 0736   Sepsis Labs: @LABRCNTIP (procalcitonin:4,lacticidven:4) )No results found for this or any previous visit (from the past 240 hour(s)).   Radiological Exams on Admission: Dg Chest Portable 1 View  Result Date: 10/10/2016 CLINICAL DATA:  Chest pain and nausea EXAM: PORTABLE CHEST 1 VIEW  COMPARISON:  None. FINDINGS: Cardiomegaly is noted with aortic atherosclerosis. Lungs are clear without effusion, pulmonary consolidation or pneumothorax. No acute osseous abnormality. IMPRESSION: No active disease.  Cardiomegaly with aortic atherosclerosis. Electronically Signed   By: Ashley Royalty M.D.   On: 10/10/2016 20:00    EKG: Normal sinus rhythm at 79 bpm with normal axisPVCs and ventricular bigeminy and nonspecific ST-T wave changes.   Assessment/Plan  This is a 76 y.o. male with a history of yperlipidemia and hypothyroidism now being admitted  with:  #. Syncope likely cardiogenic secondary to arrhythmia - Admit inpatient with telemetry monitoring - IV fluid hydration - Check orthostaticsand neuro checks - Check echo, carotids - Trend trops, check TSH, lipids - cardiology has been consulted by the emergency department physician. Dr. Saralyn Pilar is aware  #. Elevated troponin, rule out ACS - Monitor on telemetry -Trend troponins  #. Acute kidney injury  - IV fluids and repeat BMP in AM.  - Avoid nephrotoxic medications - Bladder scan and place foley catheter if evidence of urinary retention  #. History of hyperlipidemia Continue Lipitor  #. History of BPH - Continue Proscar  #. History of hypothyroidism - Continue Synthroid Check TSH and free T4  #. History of GERD - Continue Protonix  Admission status: inpatient, telemetry IV Fluids: normal saline Diet/Nutrition: heart healthy Consults called: cardiology  DVT Px: Lovenox, SCDs and early ambulation. Code Status: Full Code  Disposition Plan: To home in 1-2 days  All the records are reviewed and case discussed with ED provider. Management plans discussed with the patient and/or family who express understanding and agree with plan of care.  Christopher Carlson D.O. on 10/10/2016 at 10:47 PM Between 7am to 6pm - Pager - [phone] After 6pm go to [email or web address] - [employer] [facility] [place] [facility] Office [phone] CC: Primary care physician; Maryland Pink, MD   10/10/2016, 10:47 PM

## 2016-10-10 NOTE — ED Notes (Signed)
Elevated troponin of 0.04 reported to Dr Kerman Passey - VO for asa 324mg 

## 2016-10-10 NOTE — ED Notes (Signed)
Pt denies any chest pain or shortness of breath - he also states that the nausea has resolved at this time

## 2016-10-10 NOTE — ED Provider Notes (Signed)
[facility] Emergency Department Provider Note  Time seen: 7:56 PM  I have reviewed the triage vital signs and the nursing notes.   HISTORY  Chief Complaint Chest Pain and Nausea    HPI Christopher Carlson is a 76 y.o. male With a past medical history of hyperlipidemia who presents to the emergency department after a syncopal episode. According to the patient he was at home when he became very nauseated and diaphoretic had a brief syncopal event. Denies any chest pain or shortness of breath at any time. Denies abdominal pain. Did not vomit. Denies diarrhea. Denies recent fever or cough or congestion. Currently he states he feels extremely weak and fatigued as his only symptom.  Past Medical History:  Diagnosis Date  . High cholesterol   . Thyroid disease     There are no active problems to display for this patient.   Past Surgical History:  Procedure Laterality Date  . HERNIA REPAIR      Prior to Admission medications   Medication Sig Start Date End Date Taking? Authorizing Provider  atorvastatin (LIPITOR) 40 MG tablet Take 40 mg by mouth daily. 07/03/16   [provider]  clonazePAM (KLONOPIN) 0.5 MG tablet Take 0.5 mg by mouth at bedtime. 07/03/16   [provider]  finasteride (PROSCAR) 5 MG tablet Take 5 mg by mouth daily.    [provider]  levothyroxine (SYNTHROID, LEVOTHROID) 112 MCG tablet Take 112 mcg by mouth daily. 07/03/16   [provider]  oxyCODONE-acetaminophen (PERCOCET) 7.5-325 MG tablet Take 1 tablet by mouth every 6 (six) hours as needed for severe pain.    [provider]  pantoprazole (PROTONIX) 40 MG tablet Take 40 mg by mouth daily. 07/03/16   [provider]  QUEtiapine (SEROQUEL) 50 MG tablet Take 50 mg by mouth at bedtime. 07/03/16   [provider]    Allergies  Allergen Reactions  . Ciprofloxacin Rash    No family history on file.  Social History Social History   Substance Use Topics  . Smoking status: Never Smoker  . Smokeless tobacco: Never Used  . Alcohol use No    Review of Systems Constitutional: Negative for fever Cardiovascular: Negative for chest pain. Respiratory: Negative for shortness of breath. Gastrointestinal: Negative for abdominal pain Musculoskeletal: Negative for back pain Neurological: Negative for headache All other ROS negative  ____________________________________________   PHYSICAL EXAM:  VITAL SIGNS: ED Triage Vitals [10/10/16 1929]  Enc Vitals Group     BP 104/83     Pulse Rate 85     Resp (!) 22     Temp 98.2 F (36.8 C)     Temp Source Oral     SpO2 100 %     Weight 195 lb (88.5 kg)     Height 6' (1.829 m)     Head Circumference      Peak Flow      Pain Score 0     Pain Loc      Pain Edu?      Excl. in [place]?    Constitutional: Alert and oriented. Well appearing and in no distress. Eyes: Normal exam ENT   Head: Normocephalic and atraumatic.   Mouth/Throat: Mucous membranes are moist. Cardiovascular: regular rate but appears to have an irregular rhythm. No obvious murmur. Respiratory: Normal respiratory effort without tachypnea nor retractions. Breath sounds are clear and equal bilaterally. No wheezes/rales/rhonchi. Gastrointestinal: Soft and nontender. No distention.  Musculoskeletal: Nontender with normal range of  motion in all extremities. No lower extremity tenderness or edema. Neurologic:  Normal speech and language. No gross focal neurologic deficits Skin:  Skin is warm, dry and intact.  Psychiatric: Mood and affect are normal.   ____________________________________________    EKG  EKG reviewed and interpreted by myself shows sinus rhythm at 79 bpm with very frequent PVCs/occasional bigeminy. Normal intervals, normal axis, no significant ST changes noted.  ____________________________________________    RADIOLOGY  IMPRESSION: No active disease. Cardiomegaly with aortic  atherosclerosis.  ____________________________________________   INITIAL IMPRESSION / ASSESSMENT AND PLAN / ED COURSE  Pertinent labs & imaging results that were available during my care of the patient were reviewed by me and considered in my medical decision making (see chart for details).  patient presents to the emergency department with nausea, diaphoresis and a brief syncopal episode. Upon arrival the patient is having extremely frequent PVCs occasionally and bigeminy. EMS rhythm strip showed occasional runs of multiple PVCs. Patient denies any nausea or chest pain currently. Received Zofran by EMS. Denies any chest pain at any point. States he does feel extremely weak and fatigued. We will check labs and continue to closely monitor on telemetry. A zoll monitor is in place, if needed.  patient's troponin has resulted 0.04. Continues to have runs of occasional bigeminy. There is no longer symptomatic. However given the patient's slightly elevated troponin, syncopal episode and occasional runs of bigeminy we will admit to the hospital for further treatment. I discussed this with Dr. Saralyn Pilar. We will dose aspirin and admitted to the hospitalist service.  ____________________________________________   FINAL CLINICAL IMPRESSION(S) / ED DIAGNOSES  syncope bigeminy    Harvest Dark, MD 10/10/16 2021

## 2016-10-11 ENCOUNTER — Encounter: Payer: Self-pay | Admitting: Radiology

## 2016-10-11 ENCOUNTER — Inpatient Hospital Stay (HOSPITAL_COMMUNITY)
Admit: 2016-10-11 | Discharge: 2016-10-11 | Disposition: A | Discharge status: Home / Self Care | Payer: Medicare HMO | Attending: Family Medicine | Admitting: Family Medicine

## 2016-10-11 ENCOUNTER — Inpatient Hospital Stay: Payer: Medicare HMO

## 2016-10-11 DIAGNOSIS — R55 Syncope and collapse: Secondary | ICD-10-CM

## 2016-10-11 DIAGNOSIS — I5030 Unspecified diastolic (congestive) heart failure: Secondary | ICD-10-CM

## 2016-10-11 LAB — CBC
HCT: 41.6 % (ref 40.0–52.0)
Hemoglobin: 14.3 g/dL (ref 13.0–18.0)
MCH: 30.9 pg (ref 26.0–34.0)
MCHC: 34.3 g/dL (ref 32.0–36.0)
MCV: 90.3 fL (ref 80.0–100.0)
PLATELETS: 117 10*3/uL — AB (ref 150–440)
RBC: 4.61 MIL/uL (ref 4.40–5.90)
RDW: 13.6 % (ref 11.5–14.5)
WBC: 9.3 10*3/uL (ref 3.8–10.6)

## 2016-10-11 LAB — TROPONIN I
TROPONIN I: 0.58 ng/mL — AB (ref <0.03)
TROPONIN I: 0.38 ng/mL — AB (ref <0.03)
Troponin I: 0.25 ng/mL (ref <0.03)

## 2016-10-11 LAB — BASIC METABOLIC PANEL
Anion gap: 6 (ref 5–15)
BUN: 16 mg/dL (ref 6–20)
CO2: 23 mmol/L (ref 22–32)
CREATININE: 1.34 mg/dL — AB (ref 0.61–1.24)
Calcium: 8.3 mg/dL — ABNORMAL LOW (ref 8.9–10.3)
Chloride: 110 mmol/L (ref 101–111)
GFR, EST AFRICAN AMERICAN: 58 mL/min — AB (ref >60)
GFR, EST NON AFRICAN AMERICAN: 50 mL/min — AB (ref >60)
Glucose, Bld: 124 mg/dL — ABNORMAL HIGH (ref 65–99)
Potassium: 4.5 mmol/L (ref 3.5–5.1)
SODIUM: 139 mmol/L (ref 135–145)

## 2016-10-11 LAB — ECHOCARDIOGRAM COMPLETE
Height: 72 in
WEIGHTICAEL: 3072 [oz_av]

## 2016-10-11 LAB — T4, FREE: FREE T4: 1.18 ng/dL — AB (ref 0.61–1.12)

## 2016-10-11 LAB — HEPARIN LEVEL (UNFRACTIONATED)
HEPARIN UNFRACTIONATED: 0.37 [IU]/mL (ref 0.30–0.70)
Heparin Unfractionated: 0.62 IU/mL (ref 0.30–0.70)

## 2016-10-11 LAB — MAGNESIUM: Magnesium: 1.8 mg/dL (ref 1.7–2.4)

## 2016-10-11 LAB — LIPID PANEL
Cholesterol: 122 mg/dL (ref 0–200)
HDL: 28 mg/dL — ABNORMAL LOW (ref >40)
LDL CALC: 67 mg/dL (ref 0–99)
Total CHOL/HDL Ratio: 4.4 RATIO
Triglycerides: 133 mg/dL (ref <150)
VLDL: 27 mg/dL (ref 0–40)

## 2016-10-11 LAB — APTT: aPTT: 159 seconds — ABNORMAL HIGH (ref 24–36)

## 2016-10-11 LAB — PROTIME-INR
INR: 1.46
Prothrombin Time: 17.6 seconds — ABNORMAL HIGH (ref 11.4–15.2)

## 2016-10-11 LAB — GLUCOSE, CAPILLARY: Glucose-Capillary: 152 mg/dL — ABNORMAL HIGH (ref 65–99)

## 2016-10-11 LAB — PHOSPHORUS: PHOSPHORUS: 3.8 mg/dL (ref 2.5–4.6)

## 2016-10-11 LAB — TSH: TSH: 15.367 u[IU]/mL — AB (ref 0.350–4.500)

## 2016-10-11 MED ORDER — ORAL CARE MOUTH RINSE
15.0000 mL | Freq: Two times a day (BID) | OROMUCOSAL | Status: DC
  Administered 2016-10-11 – 2016-10-12 (×2): 15 mL via OROMUCOSAL

## 2016-10-11 MED ORDER — HEPARIN BOLUS VIA INFUSION
4000.0000 [IU] | Freq: Once | INTRAVENOUS | Status: AC
  Administered 2016-10-11: 4000 [IU] via INTRAVENOUS
  Filled 2016-10-11: qty 4000

## 2016-10-11 MED ORDER — TECHNETIUM TC 99M DIETHYLENETRIAME-PENTAACETIC ACID
30.0000 | Freq: Once | INTRAVENOUS | Status: AC | PRN
  Administered 2016-10-11: 28.747 via RESPIRATORY_TRACT

## 2016-10-11 MED ORDER — ASPIRIN EC 81 MG PO TBEC
81.0000 mg | DELAYED_RELEASE_TABLET | Freq: Every day | ORAL | Status: DC
  Administered 2016-10-11 – 2016-10-13 (×2): 81 mg via ORAL
  Filled 2016-10-11 (×2): qty 1

## 2016-10-11 MED ORDER — TECHNETIUM TO 99M ALBUMIN AGGREGATED
4.0000 | Freq: Once | INTRAVENOUS | Status: AC | PRN
  Administered 2016-10-11: 4.102 via INTRAVENOUS

## 2016-10-11 MED ORDER — HEPARIN (PORCINE) IN NACL 100-0.45 UNIT/ML-% IJ SOLN
1000.0000 [IU]/h | INTRAMUSCULAR | Status: DC
  Administered 2016-10-11 – 2016-10-12 (×2): 1000 [IU]/h via INTRAVENOUS
  Filled 2016-10-11 (×2): qty 250

## 2016-10-11 MED ORDER — CLONAZEPAM 0.5 MG PO TABS
0.5000 mg | ORAL_TABLET | Freq: Every day | ORAL | Status: DC
  Administered 2016-10-11 – 2016-10-12 (×3): 0.5 mg via ORAL
  Filled 2016-10-11 (×3): qty 1

## 2016-10-11 NOTE — Progress Notes (Signed)
Patient arrived to 2A Room 241. Patient denies pain and all questions answered. Patient oriented to unit and Fall Safety Plan signed. Skin assessment completed with Yasmin S and skin intact. A&Ox4, VSS, and showing ventricular bigeminy on verified tele-box #[id number]. Nursing staff will continue to monitor for any changes in patient status. Earleen Reaper, RN

## 2016-10-11 NOTE — Consult Note (Signed)
Reason for Consult:Non STEMI/USDA/Syncope Referring Physician: Dr Posey Pronto hospitalist  Christopher Carlson is an 76 y.o. male.  HPI: Pt is a 76 y/o who recently moved from [place]. He c/o of vertigo nausea with SOB. He was found to have an abnormal EKG and elevated troponins.He denies prior CAD. No cp ut feels poorly.  He had a syncope episode so came to the ER after his daugther call rescue.  Past Medical History:  Diagnosis Date  . High cholesterol   . Thyroid disease     Past Surgical History:  Procedure Laterality Date  . HERNIA REPAIR      No family history on file.  Social History:  reports that he has never smoked. He has never used smokeless tobacco. He reports that he does not drink alcohol or use drugs.  Allergies:  Allergies  Allergen Reactions  . Ciprofloxacin Rash    Medications: I have reviewed the patient's current medications.  Results for orders placed or performed during the hospital encounter of 10/10/16 (from the past 48 hour(s))  CBC     Status: Abnormal   Collection Time: 10/10/16  7:30 PM  Result Value Ref Range   WBC 8.5 3.8 - 10.6 K/uL   RBC 4.88 4.40 - 5.90 MIL/uL   Hemoglobin 15.1 13.0 - 18.0 g/dL   HCT 45.0 40.0 - 52.0 %   MCV 92.3 80.0 - 100.0 fL   MCH 30.9 26.0 - 34.0 pg   MCHC 33.5 32.0 - 36.0 g/dL   RDW 13.9 11.5 - 14.5 %   Platelets 109 (L) 150 - 440 K/uL  Comprehensive metabolic panel     Status: Abnormal   Collection Time: 10/10/16  7:30 PM  Result Value Ref Range   Sodium 140 135 - 145 mmol/L   Potassium 4.0 3.5 - 5.1 mmol/L   Chloride 106 101 - 111 mmol/L   CO2 24 22 - 32 mmol/L   Glucose, Bld 138 (H) 65 - 99 mg/dL   BUN 16 6 - 20 mg/dL   Creatinine, Ser 1.53 (H) 0.61 - 1.24 mg/dL   Calcium 8.8 (L) 8.9 - 10.3 mg/dL   Total Protein 7.2 6.5 - 8.1 g/dL   Albumin 3.8 3.5 - 5.0 g/dL   AST 21 15 - 41 U/L   ALT 14 (L) 17 - 63 U/L   Alkaline Phosphatase 45 38 - 126 U/L   Total Bilirubin 1.1 0.3 - 1.2 mg/dL   GFR calc non Af Amer 42 (L)  >60 mL/min   GFR calc Af Amer 49 (L) >60 mL/min    Comment: (NOTE) The eGFR has been calculated using the CKD EPI equation. This calculation has not been validated in all clinical situations. eGFR's persistently <60 mL/min signify possible Chronic Kidney Disease.    Anion gap 10 5 - 15  Troponin I     Status: Abnormal   Collection Time: 10/10/16  7:30 PM  Result Value Ref Range   Troponin I 0.04 (HH) <0.03 ng/mL    Comment: CRITICAL RESULT CALLED TO, READ BACK BY AND VERIFIED WITH TERESA CLAPP 10/10/16 @ 2011  MLK   T4, free     Status: Abnormal   Collection Time: 10/10/16  7:30 PM  Result Value Ref Range   Free T4 1.18 (H) 0.61 - 1.12 ng/dL    Comment: (NOTE) Biotin ingestion may interfere with free T4 tests. If the results are inconsistent with the TSH level, previous test results, or the clinical presentation, then consider biotin interference. If  needed, order repeat testing after stopping biotin.   Magnesium     Status: None   Collection Time: 10/10/16  7:30 PM  Result Value Ref Range   Magnesium 1.8 1.7 - 2.4 mg/dL  Phosphorus     Status: None   Collection Time: 10/10/16  7:30 PM  Result Value Ref Range   Phosphorus 3.8 2.5 - 4.6 mg/dL  TSH     Status: Abnormal   Collection Time: 10/10/16  7:30 PM  Result Value Ref Range   TSH 15.367 (H) 0.350 - 4.500 uIU/mL    Comment: Performed by a 3rd Generation assay with a functional sensitivity of <=0.01 uIU/mL.  Basic metabolic panel     Status: Abnormal   Collection Time: 10/11/16  1:17 AM  Result Value Ref Range   Sodium 139 135 - 145 mmol/L   Potassium 4.5 3.5 - 5.1 mmol/L   Chloride 110 101 - 111 mmol/L   CO2 23 22 - 32 mmol/L   Glucose, Bld 124 (H) 65 - 99 mg/dL   BUN 16 6 - 20 mg/dL   Creatinine, Ser 1.34 (H) 0.61 - 1.24 mg/dL   Calcium 8.3 (L) 8.9 - 10.3 mg/dL   GFR calc non Af Amer 50 (L) >60 mL/min   GFR calc Af Amer 58 (L) >60 mL/min    Comment: (NOTE) The eGFR has been calculated using the CKD EPI  equation. This calculation has not been validated in all clinical situations. eGFR's persistently <60 mL/min signify possible Chronic Kidney Disease.    Anion gap 6 5 - 15  CBC     Status: Abnormal   Collection Time: 10/11/16  1:17 AM  Result Value Ref Range   WBC 9.3 3.8 - 10.6 K/uL   RBC 4.61 4.40 - 5.90 MIL/uL   Hemoglobin 14.3 13.0 - 18.0 g/dL   HCT 41.6 40.0 - 52.0 %   MCV 90.3 80.0 - 100.0 fL   MCH 30.9 26.0 - 34.0 pg   MCHC 34.3 32.0 - 36.0 g/dL   RDW 13.6 11.5 - 14.5 %   Platelets 117 (L) 150 - 440 K/uL  Lipid panel     Status: Abnormal   Collection Time: 10/11/16  1:17 AM  Result Value Ref Range   Cholesterol 122 0 - 200 mg/dL   Triglycerides 133 <150 mg/dL   HDL 28 (L) >40 mg/dL   Total CHOL/HDL Ratio 4.4 RATIO   VLDL 27 0 - 40 mg/dL   LDL Cholesterol 67 0 - 99 mg/dL    Comment:        Total Cholesterol/HDL:CHD Risk Coronary Heart Disease Risk Table                     Men   Women  1/2 Average Risk   3.4   3.3  Average Risk       5.0   4.4  2 X Average Risk   9.6   7.1  3 X Average Risk  23.4   11.0        Use the calculated Patient Ratio above and the CHD Risk Table to determine the patient's CHD Risk.        ATP III CLASSIFICATION (LDL):  <100     mg/dL   Optimal  100-129  mg/dL   Near or Above                    Optimal  130-159  mg/dL   Borderline  160-189  mg/dL   High  >190     mg/dL   Very High   Troponin I     Status: Abnormal   Collection Time: 10/11/16  1:17 AM  Result Value Ref Range   Troponin I 0.58 (HH) <0.03 ng/mL    Comment: CRITICAL RESULT CALLED TO, READ BACK BY AND VERIFIED WITH KARA CAMPBELL @ 0209 ON 10/11/2016 BY CAF   APTT     Status: Abnormal   Collection Time: 10/11/16  2:51 AM  Result Value Ref Range   aPTT 159 (H) 24 - 36 seconds    Comment:        IF BASELINE aPTT IS ELEVATED, SUGGEST PATIENT RISK ASSESSMENT BE USED TO DETERMINE APPROPRIATE ANTICOAGULANT THERAPY.   Protime-INR     Status: Abnormal   Collection  Time: 10/11/16  2:51 AM  Result Value Ref Range   Prothrombin Time 17.6 (H) 11.4 - 15.2 seconds   INR 1.46   Troponin I     Status: Abnormal   Collection Time: 10/11/16  8:22 AM  Result Value Ref Range   Troponin I 0.38 (HH) <0.03 ng/mL    Comment: CRITICAL VALUE NOTED. VALUE IS CONSISTENT WITH PREVIOUSLY REPORTED/CALLED VALUE JAG  Glucose, capillary     Status: Abnormal   Collection Time: 10/11/16  8:50 AM  Result Value Ref Range   Glucose-Capillary 152 (H) 65 - 99 mg/dL  Heparin level (unfractionated)     Status: None   Collection Time: 10/11/16 11:03 AM  Result Value Ref Range   Heparin Unfractionated 0.62 0.30 - 0.70 IU/mL    Comment:        IF HEPARIN RESULTS ARE BELOW EXPECTED VALUES, AND PATIENT DOSAGE HAS BEEN CONFIRMED, SUGGEST FOLLOW UP TESTING OF ANTITHROMBIN III LEVELS.   Troponin I     Status: Abnormal   Collection Time: 10/11/16  2:24 PM  Result Value Ref Range   Troponin I 0.25 (HH) <0.03 ng/mL    Comment: CRITICAL VALUE NOTED. VALUE IS CONSISTENT WITH PREVIOUSLY REPORTED/CALLED VALUE KLW  Heparin level (unfractionated)     Status: None   Collection Time: 10/11/16  6:21 PM  Result Value Ref Range   Heparin Unfractionated 0.37 0.30 - 0.70 IU/mL    Comment:        IF HEPARIN RESULTS ARE BELOW EXPECTED VALUES, AND PATIENT DOSAGE HAS BEEN CONFIRMED, SUGGEST FOLLOW UP TESTING OF ANTITHROMBIN III LEVELS.     US Carotid Bilateral  Result Date: 10/11/2016 CLINICAL DATA:  76 year old male with a history of syncope. Cardiovascular risk factors include hyperlipidemia EXAM: BILATERAL CAROTID DUPLEX ULTRASOUND TECHNIQUE: Pearline Cables scale imaging, color Doppler and duplex ultrasound were performed of bilateral carotid and vertebral arteries in the neck. COMPARISON:  No prior FINDINGS: Criteria: Quantification of carotid stenosis is based on velocity parameters that correlate the residual internal carotid diameter with NASCET-based stenosis levels, using the diameter of the  distal internal carotid lumen as the denominator for stenosis measurement. The following velocity measurements were obtained: RIGHT ICA:  Systolic 69 cm/sec, Diastolic 6 cm/sec CCA:  1 Hunt cm/sec SYSTOLIC ICA/CCA RATIO:  0.7 ECA:  93 cm/sec LEFT ICA:  Systolic 69 cm/sec, Diastolic 12 cm/sec CCA:  98 cm/sec SYSTOLIC ICA/CCA RATIO:  0.7 ECA:  74 cm/sec Right Brachial SBP: Not acquired Left Brachial SBP: Not acquired RIGHT CAROTID ARTERY: No significant calcifications of the right common carotid artery. Intermediate waveform maintained. Heterogeneous and partially calcified plaque at the right carotid bifurcation. No significant lumen shadowing. Low resistance  waveform of the right ICA. No significant tortuosity. RIGHT VERTEBRAL ARTERY: Antegrade flow with low resistance waveform. LEFT CAROTID ARTERY: No significant calcifications of the left common carotid artery. Intermediate waveform maintained. Heterogeneous and partially calcified plaque at the left carotid bifurcation without significant lumen shadowing. Low resistance waveform of the left ICA. No significant tortuosity. LEFT VERTEBRAL ARTERY:  Antegrade flow with low resistance waveform. IMPRESSION: Color duplex indicates minimal heterogeneous and calcified plaque, with no hemodynamically significant stenosis by duplex criteria in the extracranial cerebrovascular circulation. Signed, Dulcy Fanny. Earleen Newport, DO [facility] Electronically Signed   By: Corrie Mckusick D.O.   On: 10/11/2016 10:01   Nm Pulmonary Perf And Vent  Result Date: 10/11/2016 CLINICAL DATA:  Syncopal episode, hypoxia, question pulmonary embolism, elevated troponins EXAM: NUCLEAR MEDICINE VENTILATION - PERFUSION LUNG SCAN TECHNIQUE: Ventilation images were obtained in multiple projections using inhaled aerosol [id number]. Perfusion images were obtained in multiple projections after intravenous injection of Tc-981mAA.  RADIOPHARMACEUTICALS:  28.747 mCi Technetium-9938mPA aerosol inhalation and 4.102 mCi Technetium-48m11m IV COMPARISON:  None Radiographic correlation:  Chest radiograph 10/10/2016 FINDINGS: Ventilation: Mild central airway deposition of aerosol. Subsegmental ventilation defect at LEFT lower lobe base. Diminished ventilation posterior RIGHT lower lobe base. Perfusion: Small subsegmental perfusion defect at base of LEFT lower lobe, matching. Normal perfusion to RIGHT lower lobe. Subsegmental perfusion defects at lateral RIGHT upper lobe and in lingula. Chest radiograph:  No acute infiltrate IMPRESSION: Low probability for pulmonary embolism. Electronically Signed   By: MarkLavonia Dana.   On: 10/11/2016 16:28   Dg Chest Portable 1 View  Result Date: 10/10/2016 CLINICAL DATA:  Chest pain and nausea EXAM: PORTABLE CHEST 1 VIEW COMPARISON:  None. FINDINGS: Cardiomegaly is noted with aortic atherosclerosis. Lungs are clear without effusion, pulmonary consolidation or pneumothorax. No acute osseous abnormality. IMPRESSION: No active disease.  Cardiomegaly with aortic atherosclerosis. Electronically Signed   By: DaviAshley Royalty.   On: 10/10/2016 20:00    Review of Systems  Constitutional: Positive for diaphoresis and malaise/fatigue.  HENT: Positive for congestion.   Eyes: Negative.   Respiratory: Positive for cough and shortness of breath.   Cardiovascular: Positive for palpitations.  Gastrointestinal: Positive for nausea.  Genitourinary: Negative.   Musculoskeletal: Positive for myalgias.  Skin: Negative.   Neurological: Positive for dizziness, loss of consciousness and weakness.  Endo/Heme/Allergies: Negative.   Psychiatric/Behavioral: The patient has insomnia.    Blood pressure (!) 100/58, pulse (!) 29, temperature 97.6 F (36.4 C), temperature source Oral, resp. rate 18, height 6' (1.829 m), weight 192 lb (87.1 kg), SpO2 94 %. Physical Exam  Nursing note and vitals reviewed. Constitutional: He  is oriented to person, place, and time. He appears well-developed and well-nourished.  HENT:  Head: Normocephalic and atraumatic.  Eyes: Pupils are equal, round, and reactive to light. Conjunctivae and EOM are normal.  Neck: Normal range of motion. Neck supple.  Cardiovascular: Normal rate, regular rhythm and normal heart sounds.   Respiratory: Effort normal and breath sounds normal.  GI: Soft. Bowel sounds are normal.  Musculoskeletal: Normal range of motion.  Neurological: He is alert and oriented to person, place, and time. He has normal reflexes.  Skin: Skin is warm and dry.  Psychiatric: He has a normal mood and affect.    Assessment/Plan: Non STEMI Elevated troponins Syncope Abnormal Ekg Vertigo Hypoxemia Insomia CRI stage II .. PLMarland KitchenN ROMI/F/U troponin F/U Ekgs Continue IV anticoug with Heparin Consider cardiac cath ECHO for  evalaution of LVF Wean NTG Carotid dopplers Consider neurology in put    Dwayne D Callwood 10/11/2016, 11:27 PM

## 2016-10-11 NOTE — Progress Notes (Signed)
ANTICOAGULATION CONSULT NOTE - Initial Consult  Pharmacy Consult for heparin Indication: chest pain/ACS  Allergies  Allergen Reactions  . Ciprofloxacin Rash    Patient Measurements: Height: 6' (182.9 cm) Weight: 191 lb 12.8 oz (87 kg) IBW/kg (Calculated) : 77.6 Heparin Dosing Weight: 87 kg  Vital Signs: Temp: 97.7 F (36.5 C) (09/13 0002) Temp Source: Oral (09/13 0002) BP: 117/74 (09/13 0007) Pulse Rate: 57 (09/13 0007)  Labs:  Recent Labs  10/10/16 1930 10/11/16 0117  HGB 15.1 14.3  HCT 45.0 41.6  PLT 109* 117*  CREATININE 1.53* 1.34*  TROPONINI 0.04* 0.58*    Estimated Creatinine Clearance: 51.5 mL/min (A) (by C-G formula based on SCr of 1.34 mg/dL (H)).   Medical History: Past Medical History:  Diagnosis Date  . High cholesterol   . Thyroid disease     Medications:  Scheduled:  . aspirin EC  81 mg Oral Daily  . atorvastatin  40 mg Oral Daily  . clonazePAM  0.5 mg Oral QHS  . enoxaparin (LOVENOX) injection  40 mg Subcutaneous Q24H  . finasteride  5 mg Oral Daily  . heparin  4,000 Units Intravenous Once  . levothyroxine  112 mcg Oral QAC breakfast  . mouth rinse  15 mL Mouth Rinse BID  . pantoprazole  40 mg Oral Daily  . QUEtiapine  50 mg Oral QHS  . sodium chloride flush  3 mL Intravenous Q12H    Assessment: Patient admitted for CP/nausea was found to have rising trops up to 0.58. Patient is being started on heparin drip for possible NSTEMI  Goal of Therapy:  Heparin level 0.3 - 0.7 units/mL Monitor platelets by anticoagulation protocol: Yes   Plan:  Give 4000 units bolus x 1  Will start infusion @ 1000 units/hr and will check HL @ 1100. Baseline labs ordered. Will monitor daily CBCs and troponin.  Tobie Lords, PharmD, BCPS Clinical Pharmacist 10/11/2016

## 2016-10-11 NOTE — Progress Notes (Signed)
CRITICAL VALUE ALERT  Critical Value:  Troponin 0.58  Date & Time Notied:  10/11/2016 0209  Provider Notified: Pyreddy MD  Orders Received/Actions taken: Dr. Estanislado Pandy to discuss with admitting MD Hugelmeyer.   Nursing staff will continue to monitor for any changes in patient status. Earleen Reaper, RN

## 2016-10-11 NOTE — Progress Notes (Signed)
[place] [facility]                                                                                                                                                                                  Patient Demographics   Christopher Carlson, is a 76 y.o. male, DOB - [birth year], LZJ:[id number]  Admit date - 10/10/2016   Admitting Physician Harvie Bridge, DO  Outpatient Primary MD for the patient is [place] Pink, MD   LOS - 1  Subjective: Patient admitted with syncope. He states that he is feeling fine now. Not complaining of any chest pain or shortness of breath    Review of Systems:   CONSTITUTIONAL: No documented fever. No fatigue, weakness. No weight gain, no weight loss.  EYES: No blurry or double vision.  ENT: No tinnitus. No postnasal drip. No redness of the oropharynx.  RESPIRATORY: No cough, no wheeze, no hemoptysis. No dyspnea.  CARDIOVASCULAR: No chest pain. No orthopnea. No palpitations.positive syncope.  GASTROINTESTINAL: No nausea, no vomiting or diarrhea. No abdominal pain. No melena or hematochezia.  GENITOURINARY: No dysuria or hematuria.  ENDOCRINE: No polyuria or nocturia. No heat or cold intolerance.  HEMATOLOGY: No anemia. No bruising. No bleeding.  INTEGUMENTARY: No rashes. No lesions.  MUSCULOSKELETAL: No arthritis. No swelling. No gout.  NEUROLOGIC: No numbness, tingling, or ataxia. No seizure-type activity.  PSYCHIATRIC: No anxiety. No insomnia. No ADD.    Vitals:   Vitals:   10/11/16 0007 10/11/16 0641 10/11/16 0754 10/11/16 1221  BP: 117/74 103/72 109/66 (!) 107/58  Pulse: (!) 57 (!) 37 65 68  Resp:  18 16   Temp:  (!) 97.4 F (36.3 C) 97.8 F (36.6 C)   TempSrc:  Oral Oral   SpO2: 94% 96% 93% 95%  Weight:  192 lb (87.1 kg)    Height:        Wt Readings from Last 3 Encounters:  10/11/16 192 lb (87.1 kg)     Intake/Output Summary (Last 24 hours) at 10/11/16 1411 Last data filed at 10/11/16 1023  Gross per  24 hour  Intake          1709.58 ml  Output              325 ml  Net          1384.58 ml    Physical Exam:   GENERAL: Pleasant-appearing in no apparent distress.  HEAD, EYES, EARS, NOSE AND THROAT: Atraumatic, normocephalic. Extraocular muscles are intact. Pupils equal and reactive to light. Sclerae anicteric. No conjunctival injection. No oro-pharyngeal erythema.  NECK: Supple. There is no jugular venous distention. No bruits, no lymphadenopathy, no thyromegaly.  HEART: Regular rate and rhythm,. No murmurs, no rubs, no clicks.  LUNGS: Clear to auscultation bilaterally. No rales or rhonchi. No wheezes.  ABDOMEN: Soft, flat, nontender, nondistended. Has good bowel sounds. No hepatosplenomegaly appreciated.  EXTREMITIES: No evidence of any cyanosis, clubbing, or peripheral edema.  +2 pedal and radial pulses bilaterally.  NEUROLOGIC: The patient is alert, awake, and oriented x3 with no focal motor or sensory deficits appreciated bilaterally.  SKIN: Moist and warm with no rashes appreciated.  Psych: Not anxious, depressed LN: No inguinal LN enlargement    Antibiotics   Anti-infectives    None      Medications   Scheduled Meds: . aspirin EC  81 mg Oral Daily  . atorvastatin  40 mg Oral Daily  . clonazePAM  0.5 mg Oral QHS  . finasteride  5 mg Oral Daily  . levothyroxine  112 mcg Oral QAC breakfast  . mouth rinse  15 mL Mouth Rinse BID  . pantoprazole  40 mg Oral Daily  . QUEtiapine  50 mg Oral QHS  . sodium chloride flush  3 mL Intravenous Q12H   Continuous Infusions: . sodium chloride 75 mL/hr at 10/11/16 0045  . heparin 1,000 Units/hr (10/11/16 0251)   PRN Meds:.acetaminophen **OR** acetaminophen, alum & mag hydroxide-simeth, bisacodyl, HYDROcodone-acetaminophen, ipratropium-albuterol, magnesium citrate, ondansetron **OR** ondansetron (ZOFRAN) IV, senna-docusate, zolpidem   Data Review:   Micro Results No results found for this or any previous visit (from the past 240  hour(s)).  Radiology Reports US Carotid Bilateral  Result Date: 10/11/2016 CLINICAL DATA:  76 year old male with a history of syncope. Cardiovascular risk factors include hyperlipidemia EXAM: BILATERAL CAROTID DUPLEX ULTRASOUND TECHNIQUE: Pearline Cables scale imaging, color Doppler and duplex ultrasound were performed of bilateral carotid and vertebral arteries in the neck. COMPARISON:  No prior FINDINGS: Criteria: Quantification of carotid stenosis is based on velocity parameters that correlate the residual internal carotid diameter with NASCET-based stenosis levels, using the diameter of the distal internal carotid lumen as the denominator for stenosis measurement. The following velocity measurements were obtained: RIGHT ICA:  Systolic 69 cm/sec, Diastolic 6 cm/sec CCA:  1 Hunt cm/sec SYSTOLIC ICA/CCA RATIO:  0.7 ECA:  93 cm/sec LEFT ICA:  Systolic 69 cm/sec, Diastolic 12 cm/sec CCA:  98 cm/sec SYSTOLIC ICA/CCA RATIO:  0.7 ECA:  74 cm/sec Right Brachial SBP: Not acquired Left Brachial SBP: Not acquired RIGHT CAROTID ARTERY: No significant calcifications of the right common carotid artery. Intermediate waveform maintained. Heterogeneous and partially calcified plaque at the right carotid bifurcation. No significant lumen shadowing. Low resistance waveform of the right ICA. No significant tortuosity. RIGHT VERTEBRAL ARTERY: Antegrade flow with low resistance waveform. LEFT CAROTID ARTERY: No significant calcifications of the left common carotid artery. Intermediate waveform maintained. Heterogeneous and partially calcified plaque at the left carotid bifurcation without significant lumen shadowing. Low resistance waveform of the left ICA. No significant tortuosity. LEFT VERTEBRAL ARTERY:  Antegrade flow with low resistance waveform. IMPRESSION: Color duplex indicates minimal heterogeneous and calcified plaque, with no hemodynamically significant stenosis by duplex criteria in the extracranial cerebrovascular circulation.  Signed, Dulcy Fanny. Earleen Newport, DO [facility] Radiology Electronically Signed   By: Corrie Mckusick D.O.   On: 10/11/2016 10:01   Dg Chest Portable 1 View  Result Date: 10/10/2016 CLINICAL DATA:  Chest pain and nausea EXAM: PORTABLE CHEST 1 VIEW COMPARISON:  None. FINDINGS: Cardiomegaly is noted with aortic atherosclerosis. Lungs are clear without effusion, pulmonary consolidation or pneumothorax. No acute osseous abnormality. IMPRESSION: No  active disease.  Cardiomegaly with aortic atherosclerosis. Electronically Signed   By: Ashley Royalty M.D.   On: 10/10/2016 20:00     CBC  Recent Labs Lab 10/10/16 1930 10/11/16 0117  WBC 8.5 9.3  HGB 15.1 14.3  HCT 45.0 41.6  PLT 109* 117*  MCV 92.3 90.3  MCH 30.9 30.9  MCHC 33.5 34.3  RDW 13.9 13.6    Chemistries   Recent Labs Lab 10/10/16 1930 10/11/16 0117  NA 140 139  K 4.0 4.5  CL 106 110  CO2 24 23  GLUCOSE 138* 124*  BUN 16 16  CREATININE 1.53* 1.34*  CALCIUM 8.8* 8.3*  MG 1.8  --   AST 21  --   ALT 14*  --   ALKPHOS 45  --   BILITOT 1.1  --    ------------------------------------------------------------------------------------------------------------------ estimated creatinine clearance is 51.5 mL/min (A) (by C-G formula based on SCr of 1.34 mg/dL (H)). ------------------------------------------------------------------------------------------------------------------ No results for input(s): HGBA1C in the last 72 hours. ------------------------------------------------------------------------------------------------------------------  Recent Labs  10/11/16 0117  CHOL 122  HDL 28*  LDLCALC 67  TRIG 133  CHOLHDL 4.4   ------------------------------------------------------------------------------------------------------------------  Recent Labs  10/10/16 1930  TSH 15.367*    ------------------------------------------------------------------------------------------------------------------ No results for input(s): VITAMINB12, FOLATE, FERRITIN, TIBC, IRON, RETICCTPCT in the last 72 hours.  Coagulation profile  Recent Labs Lab 10/11/16 0251  INR 1.46    No results for input(s): DDIMER in the last 72 hours.  Cardiac Enzymes  Recent Labs Lab 10/10/16 1930 10/11/16 0117 10/11/16 0822  TROPONINI 0.04* 0.58* 0.38*   ------------------------------------------------------------------------------------------------------------------ Invalid input(s): POCBNP    Assessment & Plan   This is a 76 y.o. male with a history of yperlipidemia and hypothyroidism now being admitted with:  #. Syncope likely cardiogenic secondary to arrhythmia Echocardiogram with no significant valvular disorder Cardiology consult pending no further arrhythmia noted  #. Elevated troponin, rule out ACS -patient's troponin did increase however I feel that this is likely due to demand ischemia with this trending down. Currently on heparin Cardiology consult is pending Will likely need some sort of cardiac evaluation at some point  #. Acute kidney injury  -renal function improved with IV fluid  #. History of hyperlipidemia Continue Lipitor  #. History of BPH - Continue Proscar  #. History of hypothyroidism - TSH is elevated however free T4 also elevatedat this point I would keep the same dosage  #. History of GERD     Code Status Orders        Start     Ordered   10/10/16 2353  Full code  Continuous     10/10/16 2352    Code Status History    Date Active Date Inactive Code Status Order ID Comments User Context   This patient has a current code status but no historical code status.    Advance Directive Documentation     Most Recent Value  Type of Advance Directive  Living will  Pre-existing out of facility DNR order (yellow form or pink MOST form)  -   "MOST" Form in Place?  -           Consults  Cardiology and neurology  DVT Prophylaxis heparin drip  Lab Results  Component Value Date   PLT 117 (L) 10/11/2016     Time Spent in minutes   35 minutes  Greater than 50% of time spent in care coordination and counseling patient regarding the condition and plan of care.   Thayden Lemire, [facility]  M.D on 10/11/2016 at 2:11 PM  Between 7am to 6pm - Pager - [phone]  After 6pm go to [email or web address] - password [email or web address] [facility]   Office  [phone]

## 2016-10-11 NOTE — Plan of Care (Signed)
Problem: Education: Goal: Knowledge of [facility] Education information/materials will improve Outcome: Progressing Patient admitted for syncope. Plan of care includes continuous cardiopulmonary monitoring, falls precautions, Heparin infusion for elevated CE, carotid US and ECHO. All other health conditions will be monitored while in the hospital.  Problem: Safety: Goal: Ability to remain free from injury will improve Outcome: Progressing Patient will be free of injury and falls during the shift.  Problem: Tissue Perfusion: Goal: Risk factors for ineffective tissue perfusion will decrease Outcome: Progressing Heparin gtt for cardiac perfusion per MD orders.

## 2016-10-11 NOTE — Progress Notes (Signed)
PT Cancellation Note  Patient Details Name: Christopher Carlson MRN: [id number] DOB: 1940/11/15   Cancelled Treatment:    Reason Eval/Treat Not Completed: Other (comment);Medical issues which prohibited therapy.  Has elevating troponins, no notation of the disposition yet.  Will ck later as information is more available.   Ramond Dial 10/11/2016, 8:53 AM   Mee Hives, PT MS Acute Rehab Dept. Number: [place] [facility] [place]

## 2016-10-11 NOTE — Progress Notes (Signed)
*  PRELIMINARY RESULTS* Echocardiogram 2D Echocardiogram has been performed.  Christopher Carlson 10/11/2016, 1:36 PM

## 2016-10-11 NOTE — Plan of Care (Signed)
Problem: Safety: Goal: Ability to remain free from injury will improve Outcome: Progressing  Patient's BP was 100/58, other VSS. Patient denies pain. Heparin drip at 10 ml/hr. Will be NPO at midnight for possible cardiac cath tom. Patient rested well. RN will continue to monitor.

## 2016-10-11 NOTE — Consult Note (Signed)
Reason for Consult: Syncope  Referring Physician: Dr. Posey Pronto   CC: syncope   HPI: Christopher Carlson is an 76 y.o. male with a known history of hyperlipidemia, hypothyroidism presents to the emergency department for evaluation of syncope.  Information obtain from family and pt at bedside.  Patient was in a usual state of health until last evening when he had sustained a brief loss of consciousness preceded by nausea and diaphoresis. He regained consciousness spontaneously and complains of weakness, dizziness and fatigue.. No tongue biting, urinary incontinence or seizure like activity.   Now back to baseline   Past Medical History:  Diagnosis Date  . High cholesterol   . Thyroid disease     Past Surgical History:  Procedure Laterality Date  . HERNIA REPAIR      No family history on file.  Social History:  reports that he has never smoked. He has never used smokeless tobacco. He reports that he does not drink alcohol or use drugs.  Allergies  Allergen Reactions  . Ciprofloxacin Rash    Medications: I have reviewed the patient's current medications.  ROS: History obtained from the patient  General ROS: negative for - chills, fatigue, fever, night sweats, weight gain or weight loss Psychological ROS: negative for - behavioral disorder, hallucinations, memory difficulties, mood swings or suicidal ideation Ophthalmic ROS: negative for - blurry vision, double vision, eye pain or loss of vision ENT ROS: negative for - epistaxis, nasal discharge, oral lesions, sore throat, tinnitus or vertigo Allergy and Immunology ROS: negative for - hives or itchy/watery eyes Hematological and Lymphatic ROS: negative for - bleeding problems, bruising or swollen lymph nodes Endocrine ROS: negative for - galactorrhea, hair pattern changes, polydipsia/polyuria or temperature intolerance Respiratory ROS: negative for - cough, hemoptysis, shortness of breath or wheezing Cardiovascular ROS: negative for -  chest pain, dyspnea on exertion, edema or irregular heartbeat Gastrointestinal ROS: negative for - abdominal pain, diarrhea, hematemesis, nausea/vomiting or stool incontinence Genito-Urinary ROS: negative for - dysuria, hematuria, incontinence or urinary frequency/urgency Musculoskeletal ROS: negative for - joint swelling or muscular weakness Neurological ROS: as noted in HPI Dermatological ROS: negative for rash and skin lesion changes  Physical Examination: Blood pressure (!) 107/58, pulse 68, temperature 97.8 F (36.6 C), temperature source Oral, resp. rate 16, height 6' (1.829 m), weight 87.1 kg (192 lb), SpO2 95 %.   Neurological Examination   Mental Status: Alert, oriented, thought content appropriate.  Speech fluent without evidence of aphasia.  Able to follow 3 step commands without difficulty. Cranial Nerves: II: Discs flat bilaterally; Visual fields grossly normal, pupils equal, round, reactive to light and accommodation III,IV, VI: ptosis not present, extra-ocular motions intact bilaterally V,VII: smile symmetric, facial light touch sensation normal bilaterally VIII: hearing normal bilaterally IX,X: gag reflex present XI: bilateral shoulder shrug XII: midline tongue extension Motor: Right : Upper extremity   5/5    Left:     Upper extremity   5/5  Lower extremity   5/5     Lower extremity   5/5 Tone and bulk:normal tone throughout; no atrophy noted Sensory: Pinprick and light touch intact throughout, bilaterally Deep Tendon Reflexes: 1+ and symmetric throughout Plantars: Right: downgoing   Left: downgoing Cerebellar: normal finger-to-nose, normal rapid alternating movements and normal heel-to-shin test Gait: not tested      Laboratory Studies:   Basic Metabolic Panel:  Recent Labs Lab 10/10/16 1930 10/11/16 0117  NA 140 139  K 4.0 4.5  CL 106 110  CO2 24 23  GLUCOSE 138* 124*  BUN 16 16  CREATININE 1.53* 1.34*  CALCIUM 8.8* 8.3*  MG 1.8  --   PHOS 3.8   --     Liver Function Tests:  Recent Labs Lab 10/10/16 1930  AST 21  ALT 14*  ALKPHOS 45  BILITOT 1.1  PROT 7.2  ALBUMIN 3.8   No results for input(s): LIPASE, AMYLASE in the last 168 hours. No results for input(s): AMMONIA in the last 168 hours.  CBC:  Recent Labs Lab 10/10/16 1930 10/11/16 0117  WBC 8.5 9.3  HGB 15.1 14.3  HCT 45.0 41.6  MCV 92.3 90.3  PLT 109* 117*    Cardiac Enzymes:  Recent Labs Lab 10/10/16 1930 10/11/16 0117 10/11/16 0822  TROPONINI 0.04* 0.58* 0.38*    BNP: Invalid input(s): POCBNP  CBG:  Recent Labs Lab 10/11/16 0850  GLUCAP 152*    Microbiology: No results found for this or any previous visit.  Coagulation Studies:  Recent Labs  10/11/16 0251  LABPROT 17.6*  INR 1.46    Urinalysis: No results for input(s): COLORURINE, LABSPEC, PHURINE, GLUCOSEU, HGBUR, BILIRUBINUR, KETONESUR, PROTEINUR, UROBILINOGEN, NITRITE, LEUKOCYTESUR in the last 168 hours.  Invalid input(s): APPERANCEUR  Lipid Panel:     Component Value Date/Time   CHOL 122 10/11/2016 0117   TRIG 133 10/11/2016 0117   HDL 28 (L) 10/11/2016 0117   CHOLHDL 4.4 10/11/2016 0117   VLDL 27 10/11/2016 0117   LDLCALC 67 10/11/2016 0117    HgbA1C: No results found for: HGBA1C  Urine Drug Screen:     Component Value Date/Time   LABOPIA NONE DETECTED 07/26/2016 0736   COCAINSCRNUR NONE DETECTED 07/26/2016 0736   LABBENZ NONE DETECTED 07/26/2016 0736   AMPHETMU NONE DETECTED 07/26/2016 0736   THCU NONE DETECTED 07/26/2016 0736   LABBARB NONE DETECTED 07/26/2016 0736    Alcohol Level: No results for input(s): ETH in the last 168 hours.   Imaging: US Carotid Bilateral  Result Date: 10/11/2016 CLINICAL DATA:  76 year old male with a history of syncope. Cardiovascular risk factors include hyperlipidemia EXAM: BILATERAL CAROTID DUPLEX ULTRASOUND TECHNIQUE: Pearline Cables scale imaging, color Doppler and duplex ultrasound were performed of bilateral carotid and  vertebral arteries in the neck. COMPARISON:  No prior FINDINGS: Criteria: Quantification of carotid stenosis is based on velocity parameters that correlate the residual internal carotid diameter with NASCET-based stenosis levels, using the diameter of the distal internal carotid lumen as the denominator for stenosis measurement. The following velocity measurements were obtained: RIGHT ICA:  Systolic 69 cm/sec, Diastolic 6 cm/sec CCA:  1 Hunt cm/sec SYSTOLIC ICA/CCA RATIO:  0.7 ECA:  93 cm/sec LEFT ICA:  Systolic 69 cm/sec, Diastolic 12 cm/sec CCA:  98 cm/sec SYSTOLIC ICA/CCA RATIO:  0.7 ECA:  74 cm/sec Right Brachial SBP: Not acquired Left Brachial SBP: Not acquired RIGHT CAROTID ARTERY: No significant calcifications of the right common carotid artery. Intermediate waveform maintained. Heterogeneous and partially calcified plaque at the right carotid bifurcation. No significant lumen shadowing. Low resistance waveform of the right ICA. No significant tortuosity. RIGHT VERTEBRAL ARTERY: Antegrade flow with low resistance waveform. LEFT CAROTID ARTERY: No significant calcifications of the left common carotid artery. Intermediate waveform maintained. Heterogeneous and partially calcified plaque at the left carotid bifurcation without significant lumen shadowing. Low resistance waveform of the left ICA. No significant tortuosity. LEFT VERTEBRAL ARTERY:  Antegrade flow with low resistance waveform. IMPRESSION: Color duplex indicates minimal heterogeneous and calcified plaque, with no hemodynamically significant stenosis by duplex criteria in the extracranial cerebrovascular  circulation. Signed, Dulcy Fanny. Earleen Newport, DO [facility] Radiology Electronically Signed   By: Corrie Mckusick D.O.   On: 10/11/2016 10:01   Dg Chest Portable 1 View  Result Date: 10/10/2016 CLINICAL DATA:  Chest pain and nausea EXAM: PORTABLE CHEST 1 VIEW COMPARISON:  None. FINDINGS: Cardiomegaly is  noted with aortic atherosclerosis. Lungs are clear without effusion, pulmonary consolidation or pneumothorax. No acute osseous abnormality. IMPRESSION: No active disease.  Cardiomegaly with aortic atherosclerosis. Electronically Signed   By: Ashley Royalty M.D.   On: 10/10/2016 20:00     Assessment/Plan:  76 y.o. male with a known history of hyperlipidemia, hypothyroidism presents to the emergency department for evaluation of syncope.  Information obtain from family and pt at bedside.  Patient was in a usual state of health until last evening when he had sustained a brief loss of consciousness preceded by nausea and diaphoresis. He regained consciousness spontaneously and complains of weakness, dizziness and fatigue.. No tongue biting, urinary incontinence or seizure like activity.   - Not convinced sz activity  - elevated troponin - Ultrasound no acute abnormalities - echo and cardiology evaluation pending - no need for further imaging from neuro stand point.   Leotis Pain  10/11/2016, 12:45 PM

## 2016-10-11 NOTE — Progress Notes (Signed)
Patient ID: Christopher Carlson, male   DOB: 1940-05-03, 76 y.o.   MRN: [id number]   Called by nursing re: second troponin increased to 0.58.  May be demand ischemia as patient has nonischemic EKG and no chest pain.  Will start heparin drip, continue daily aspirin and statin.  Beta blocker held due to bradycardia.   Cardiology consult has been requested.  Monitor closely.

## 2016-10-12 ENCOUNTER — Encounter: Payer: Self-pay | Admitting: Internal Medicine

## 2016-10-12 ENCOUNTER — Encounter
Admission: EM | Disposition: A | Discharge status: Home / Self Care | Payer: Self-pay | Source: Home / Self Care | Attending: Internal Medicine

## 2016-10-12 HISTORY — PX: LEFT HEART CATH AND CORONARY ANGIOGRAPHY: CATH118249

## 2016-10-12 HISTORY — PX: CORONARY STENT INTERVENTION: CATH118234

## 2016-10-12 LAB — POCT ACTIVATED CLOTTING TIME: Activated Clotting Time: 417 seconds

## 2016-10-12 LAB — CARDIAC CATHETERIZATION: Cath EF Quantitative: 45 %

## 2016-10-12 SURGERY — LEFT HEART CATH AND CORONARY ANGIOGRAPHY
Anesthesia: Moderate Sedation

## 2016-10-12 MED ORDER — IOPAMIDOL (ISOVUE-300) INJECTION 61%
INTRAVENOUS | Status: DC | PRN
  Administered 2016-10-12: 240 mL via INTRA_ARTERIAL

## 2016-10-12 MED ORDER — ASPIRIN 81 MG PO CHEW
CHEWABLE_TABLET | ORAL | Status: AC
  Filled 2016-10-12: qty 4

## 2016-10-12 MED ORDER — SODIUM CHLORIDE 0.9% FLUSH: 3.0000 mL | INTRAVENOUS | Status: DC | PRN

## 2016-10-12 MED ORDER — ASPIRIN 81 MG PO CHEW: 81.0000 mg | CHEWABLE_TABLET | Freq: Every day | ORAL | Status: DC

## 2016-10-12 MED ORDER — FENTANYL CITRATE (PF) 100 MCG/2ML IJ SOLN
INTRAMUSCULAR | Status: DC | PRN
  Administered 2016-10-12: 25 ug via INTRAVENOUS

## 2016-10-12 MED ORDER — ONDANSETRON HCL 4 MG/2ML IJ SOLN: 4.0000 mg | Freq: Four times a day (QID) | INTRAMUSCULAR | Status: DC | PRN

## 2016-10-12 MED ORDER — SODIUM CHLORIDE 0.9 % IV SOLN
INTRAVENOUS | Status: AC | PRN
  Administered 2016-10-12: 1.75 mg/kg/h via INTRAVENOUS

## 2016-10-12 MED ORDER — HEPARIN (PORCINE) IN NACL 2-0.9 UNIT/ML-% IJ SOLN
INTRAMUSCULAR | Status: AC
  Filled 2016-10-12: qty 500

## 2016-10-12 MED ORDER — TICAGRELOR 90 MG PO TABS
ORAL_TABLET | ORAL | Status: DC | PRN
  Administered 2016-10-12: 180 mg via ORAL

## 2016-10-12 MED ORDER — TICAGRELOR 90 MG PO TABS
ORAL_TABLET | ORAL | Status: AC
  Filled 2016-10-12: qty 1

## 2016-10-12 MED ORDER — BIVALIRUDIN BOLUS VIA INFUSION - CUPID
INTRAVENOUS | Status: DC | PRN
  Administered 2016-10-12: 65.025 mg via INTRAVENOUS

## 2016-10-12 MED ORDER — SODIUM CHLORIDE 0.9% FLUSH
3.0000 mL | Freq: Two times a day (BID) | INTRAVENOUS | Status: DC
  Administered 2016-10-12: 3 mL via INTRAVENOUS

## 2016-10-12 MED ORDER — SODIUM CHLORIDE 0.9 % WEIGHT BASED INFUSION: 3.0000 mL/kg/h | INTRAVENOUS | Status: DC

## 2016-10-12 MED ORDER — LABETALOL HCL 5 MG/ML IV SOLN: 10.0000 mg | INTRAVENOUS | Status: AC | PRN

## 2016-10-12 MED ORDER — ASPIRIN 81 MG PO CHEW: 81.0000 mg | CHEWABLE_TABLET | ORAL | Status: DC

## 2016-10-12 MED ORDER — SODIUM CHLORIDE 0.9 % IV SOLN
0.2500 mg/kg/h | INTRAVENOUS | Status: AC
  Filled 2016-10-12: qty 250

## 2016-10-12 MED ORDER — FENTANYL CITRATE (PF) 100 MCG/2ML IJ SOLN
INTRAMUSCULAR | Status: AC
  Filled 2016-10-12: qty 2

## 2016-10-12 MED ORDER — LIDOCAINE HCL (PF) 1 % IJ SOLN
INTRAMUSCULAR | Status: AC
  Filled 2016-10-12: qty 30

## 2016-10-12 MED ORDER — MIDAZOLAM HCL 2 MG/2ML IJ SOLN
INTRAMUSCULAR | Status: AC
  Filled 2016-10-12: qty 2

## 2016-10-12 MED ORDER — HYDRALAZINE HCL 20 MG/ML IJ SOLN: 5.0000 mg | INTRAMUSCULAR | Status: AC | PRN

## 2016-10-12 MED ORDER — ENOXAPARIN SODIUM 40 MG/0.4ML ~~LOC~~ SOLN
40.0000 mg | SUBCUTANEOUS | Status: DC
  Administered 2016-10-12: 40 mg via SUBCUTANEOUS
  Filled 2016-10-12: qty 0.4

## 2016-10-12 MED ORDER — SODIUM CHLORIDE 0.9 % IV SOLN: 250.0000 mL | INTRAVENOUS | Status: DC | PRN

## 2016-10-12 MED ORDER — NITROGLYCERIN 5 MG/ML IV SOLN
INTRAVENOUS | Status: AC
  Filled 2016-10-12: qty 10

## 2016-10-12 MED ORDER — MIDAZOLAM HCL 2 MG/2ML IJ SOLN
INTRAMUSCULAR | Status: DC | PRN
  Administered 2016-10-12: 0.5 mg via INTRAVENOUS

## 2016-10-12 MED ORDER — TICAGRELOR 90 MG PO TABS
90.0000 mg | ORAL_TABLET | Freq: Two times a day (BID) | ORAL | Status: DC
  Administered 2016-10-12 – 2016-10-13 (×2): 90 mg via ORAL
  Filled 2016-10-12 (×2): qty 1

## 2016-10-12 MED ORDER — ACETAMINOPHEN 325 MG PO TABS: 650.0000 mg | ORAL_TABLET | ORAL | Status: DC | PRN

## 2016-10-12 MED ORDER — SODIUM CHLORIDE 0.9% FLUSH
3.0000 mL | Freq: Two times a day (BID) | INTRAVENOUS | Status: DC
  Administered 2016-10-13: 3 mL via INTRAVENOUS

## 2016-10-12 MED ORDER — SODIUM CHLORIDE 0.9 % WEIGHT BASED INFUSION
1.0000 mL/kg/h | INTRAVENOUS | Status: AC
  Administered 2016-10-12: 1 mL/kg/h via INTRAVENOUS

## 2016-10-12 MED ORDER — TICAGRELOR 90 MG PO TABS
ORAL_TABLET | ORAL | Status: AC
  Filled 2016-10-12: qty 2

## 2016-10-12 MED ORDER — SODIUM CHLORIDE 0.9 % WEIGHT BASED INFUSION: 1.0000 mL/kg/h | INTRAVENOUS | Status: DC

## 2016-10-12 MED ORDER — BIVALIRUDIN TRIFLUOROACETATE 250 MG IV SOLR
INTRAVENOUS | Status: AC
  Filled 2016-10-12: qty 250

## 2016-10-12 MED ORDER — ASPIRIN 81 MG PO CHEW
CHEWABLE_TABLET | ORAL | Status: DC | PRN
  Administered 2016-10-12: 324 mg via ORAL

## 2016-10-12 MED ORDER — NITROGLYCERIN 1 MG/10 ML FOR IR/CATH LAB
INTRA_ARTERIAL | Status: DC | PRN
  Administered 2016-10-12 (×2): 200 ug

## 2016-10-12 SURGICAL SUPPLY — 18 items
BALLN TREK RX 3.0X20 (BALLOONS) ×3
BALLN ~~LOC~~ TREK RX 3.5X12 (BALLOONS) ×3
BALLOON TREK RX 3.0X20 (BALLOONS) ×1 IMPLANT
BALLOON ~~LOC~~ TREK RX 3.5X12 (BALLOONS) ×1 IMPLANT
CATH 5FR PIGTAIL DIAGNOSTIC (CATHETERS) ×3 IMPLANT
CATH INFINITI 5FR JL4 (CATHETERS) ×3 IMPLANT
CATH INFINITI JR4 5F (CATHETERS) ×3 IMPLANT
CATH VISTA GUIDE 6FR XB3.5 (CATHETERS) ×3 IMPLANT
DEVICE CLOSURE MYNXGRIP 6/7F (Vascular Products) ×3 IMPLANT
DEVICE INFLAT 30 PLUS (MISCELLANEOUS) ×3 IMPLANT
KIT MANI 3VAL PERCEP (MISCELLANEOUS) ×3 IMPLANT
NEEDLE PERC 18GX7CM (NEEDLE) ×3 IMPLANT
PACK CARDIAC CATH (CUSTOM PROCEDURE TRAY) ×3 IMPLANT
SHEATH AVANTI 5FR X 11CM (SHEATH) ×3 IMPLANT
SHEATH AVANTI 6FR X 11CM (SHEATH) ×3 IMPLANT
STENT SIERRA 3.00 X 18 MM (Permanent Stent) ×3 IMPLANT
WIRE EMERALD 3MM-J .035X150CM (WIRE) ×3 IMPLANT
WIRE G HI TQ BMW 190 (WIRE) ×3 IMPLANT

## 2016-10-12 NOTE — Care Management Important Message (Signed)
Important Message  Patient Details  Name: Christopher Carlson MRN: [id number] Date of Birth: 09-25-1940   Medicare Important Message Given:  Yes Signed IM notice given    Katrina Stack, RN 10/12/2016, 4:04 PM

## 2016-10-12 NOTE — Plan of Care (Signed)
Problem: Safety: Goal: Ability to remain free from injury will improve Outcome: Progressing  Patient's VSS throughout the shift. Patient denies pain. Right groin puncture site soft and non tender, no drainage noted. Patient rested well. RN will continue to monitor.

## 2016-10-12 NOTE — Care Management (Signed)
Patient to discharge on Brilinta. Patient verbally  confirmed pharmacy coverage with insurance.  Provided with coupon for 30 day trial

## 2016-10-12 NOTE — Progress Notes (Signed)
Patient referred to Cardiac Rehab with dx of NSTEMI and S/P Coronary Stent to Proximal to Mid LAD.  Patient also has lesion of 1st Diagonal Lesion, which is 50% Stenosed. Patient denies any prior cardiac problems/interventions.  Daughter and son-in-law at bedside.  Booklet "Bouncing Back after Heart Attack" given and reviewed with patient and family.  Location of stent reviewed with patient and family.  Daughter has stent card.  Instructed patient to keep this card in his wallet at all times.  Reviewed the EF measurement and significance of this value.  Patient's visualized EF during Cardiac Cath was 35 - 45%.  Care of Vascular Groin site reviewed with patient and family.  Mynx closure device was use.  Explained the Mynx Closure and that the mynx will dissolve over the next 30 days.  Discussed Heart Healthy Diet with patient.  Reviewed food choices/selection.  Dietitian Consult ordered.  Patient informed this nurse that he does not smoke.  Stressed the importance of controlling BP and Cholesterol.  Medications, rationale for each one, and side effects reviewed with patient.  Stressed the importance of taking all medications as prescribed to treat CAD. Cardiac Rehab discussed.  The importance of exercise was also discussed.  Explained to patient Dr. Clayborn Bigness has ordered Cardiac Rehab for patient.  Overview of the program explained.  Patient scheduled for Cardiac Rehab orientation on Thursday, October 25, 2016 at 11:30 a.m.    Patient and family engaged in the conversation and asked pertinent questions.    Roanna Epley, RN, BSN, [facility] Cardiovascular and Pulmonary Nurse Navigator

## 2016-10-12 NOTE — Progress Notes (Signed)
[place] at [facility]                                                                                                                                                                                  Patient Demographics   Christopher Carlson, is a 76 y.o. male, DOB - 02-Jan-1941, OZH:[id number]  Admit date - 10/10/2016   Admitting Physician Harvie Bridge, DO  Outpatient Primary MD for the patient is [place] Pink, MD   LOS - 2  Subjective: Patient admitted with syncope.  Underwent a cardiac catheter and had a stent placed   Review of Systems:   CONSTITUTIONAL: No documented fever. No fatigue, weakness. No weight gain, no weight loss.  EYES: No blurry or double vision.  ENT: No tinnitus. No postnasal drip. No redness of the oropharynx.  RESPIRATORY: No cough, no wheeze, no hemoptysis. No dyspnea.  CARDIOVASCULAR: No chest pain. No orthopnea. No palpitations.positive syncope.  GASTROINTESTINAL: No nausea, no vomiting or diarrhea. No abdominal pain. No melena or hematochezia.  GENITOURINARY: No dysuria or hematuria.  ENDOCRINE: No polyuria or nocturia. No heat or cold intolerance.  HEMATOLOGY: No anemia. No bruising. No bleeding.  INTEGUMENTARY: No rashes. No lesions.  MUSCULOSKELETAL: No arthritis. No swelling. No gout.  NEUROLOGIC: No numbness, tingling, or ataxia. No seizure-type activity.  PSYCHIATRIC: No anxiety. No insomnia. No ADD.    Vitals:   Vitals:   10/12/16 1030 10/12/16 1045 10/12/16 1100 10/12/16 1158  BP: 109/70 122/75 119/71 129/62  Pulse: (!) 53 (!) 54 (!) 55 (!) 56  Resp: (!) 21 20 17 14   Temp:    97.8 F (36.6 C)  TempSrc:    Oral  SpO2: 99% 99% 96% 100%  Weight:      Height:        Wt Readings from Last 3 Encounters:  10/12/16 191 lb 1.6 oz (86.7 kg)     Intake/Output Summary (Last 24 hours) at 10/12/16 1258 Last data filed at 10/12/16 1121  Gross per 24 hour  Intake          1070.67 ml  Output             1000 ml   Net            70.67 ml    Physical Exam:   GENERAL: Pleasant-appearing in no apparent distress.  HEAD, EYES, EARS, NOSE AND THROAT: Atraumatic, normocephalic. Extraocular muscles are intact. Pupils equal and reactive to light. Sclerae anicteric. No conjunctival injection. No oro-pharyngeal erythema.  NECK: Supple. There is no jugular venous distention. No bruits, no lymphadenopathy, no thyromegaly.  HEART: Regular rate and rhythm,. No murmurs, no rubs, no clicks.  LUNGS: Clear to auscultation bilaterally. No rales or rhonchi. No wheezes.  ABDOMEN: Soft, flat, nontender, nondistended. Has good bowel sounds. No hepatosplenomegaly appreciated.  EXTREMITIES: No evidence of any cyanosis, clubbing, or peripheral edema.  +2 pedal and radial pulses bilaterally.  NEUROLOGIC: The patient is alert, awake, and oriented x3 with no focal motor or sensory deficits appreciated bilaterally.  SKIN: Moist and warm with no rashes appreciated.  Psych: Not anxious, depressed LN: No inguinal LN enlargement    Antibiotics   Anti-infectives    None      Medications   Scheduled Meds: . aspirin  81 mg Oral Daily  . aspirin EC  81 mg Oral Daily  . atorvastatin  40 mg Oral Daily  . clonazePAM  0.5 mg Oral QHS  . finasteride  5 mg Oral Daily  . levothyroxine  112 mcg Oral QAC breakfast  . mouth rinse  15 mL Mouth Rinse BID  . pantoprazole  40 mg Oral Daily  . QUEtiapine  50 mg Oral QHS  . sodium chloride flush  3 mL Intravenous Q12H  . sodium chloride flush  3 mL Intravenous Q12H  . ticagrelor  90 mg Oral BID   Continuous Infusions: . sodium chloride 75 mL/hr at 10/11/16 1740  . sodium chloride    . sodium chloride 1 mL/kg/hr (10/12/16 1239)   PRN Meds:.sodium chloride, acetaminophen **OR** acetaminophen, acetaminophen, alum & mag hydroxide-simeth, bisacodyl, hydrALAZINE, HYDROcodone-acetaminophen, ipratropium-albuterol, labetalol, magnesium citrate, ondansetron **OR** ondansetron (ZOFRAN) IV,  ondansetron (ZOFRAN) IV, senna-docusate, sodium chloride flush, zolpidem   Data Review:   Micro Results No results found for this or any previous visit (from the past 240 hour(s)).  Radiology Reports US Carotid Bilateral  Result Date: 10/11/2016 CLINICAL DATA:  76 year old male with a history of syncope. Cardiovascular risk factors include hyperlipidemia EXAM: BILATERAL CAROTID DUPLEX ULTRASOUND TECHNIQUE: Pearline Cables scale imaging, color Doppler and duplex ultrasound were performed of bilateral carotid and vertebral arteries in the neck. COMPARISON:  No prior FINDINGS: Criteria: Quantification of carotid stenosis is based on velocity parameters that correlate the residual internal carotid diameter with NASCET-based stenosis levels, using the diameter of the distal internal carotid lumen as the denominator for stenosis measurement. The following velocity measurements were obtained: RIGHT ICA:  Systolic 69 cm/sec, Diastolic 6 cm/sec CCA:  1 Hunt cm/sec SYSTOLIC ICA/CCA RATIO:  0.7 ECA:  93 cm/sec LEFT ICA:  Systolic 69 cm/sec, Diastolic 12 cm/sec CCA:  98 cm/sec SYSTOLIC ICA/CCA RATIO:  0.7 ECA:  74 cm/sec Right Brachial SBP: Not acquired Left Brachial SBP: Not acquired RIGHT CAROTID ARTERY: No significant calcifications of the right common carotid artery. Intermediate waveform maintained. Heterogeneous and partially calcified plaque at the right carotid bifurcation. No significant lumen shadowing. Low resistance waveform of the right ICA. No significant tortuosity. RIGHT VERTEBRAL ARTERY: Antegrade flow with low resistance waveform. LEFT CAROTID ARTERY: No significant calcifications of the left common carotid artery. Intermediate waveform maintained. Heterogeneous and partially calcified plaque at the left carotid bifurcation without significant lumen shadowing. Low resistance waveform of the left ICA. No significant tortuosity. LEFT VERTEBRAL ARTERY:  Antegrade flow with low resistance waveform. IMPRESSION:  Color duplex indicates minimal heterogeneous and calcified plaque, with no hemodynamically significant stenosis by duplex criteria in the extracranial cerebrovascular circulation. Signed, Dulcy Fanny. Earleen Newport, DO [facility] Electronically Signed   By: Corrie Mckusick D.O.   On: 10/11/2016 10:01   Nm Pulmonary Perf And Vent  Result Date: 10/11/2016 CLINICAL DATA:  Syncopal episode,  hypoxia, question pulmonary embolism, elevated troponins EXAM: NUCLEAR MEDICINE VENTILATION - PERFUSION LUNG SCAN TECHNIQUE: Ventilation images were obtained in multiple projections using inhaled aerosol Tc-24m DTPA. Perfusion images were obtained in multiple projections after intravenous injection of Tc-86m MAA. RADIOPHARMACEUTICALS:  28.747 mCi Technetium-19m DTPA aerosol inhalation and 4.102 mCi Technetium-88m MAA IV COMPARISON:  None Radiographic correlation:  Chest radiograph 10/10/2016 FINDINGS: Ventilation: Mild central airway deposition of aerosol. Subsegmental ventilation defect at LEFT lower lobe base. Diminished ventilation posterior RIGHT lower lobe base. Perfusion: Small subsegmental perfusion defect at base of LEFT lower lobe, matching. Normal perfusion to RIGHT lower lobe. Subsegmental perfusion defects at lateral RIGHT upper lobe and in lingula. Chest radiograph:  No acute infiltrate IMPRESSION: Low probability for pulmonary embolism. Electronically Signed   By: Lavonia Dana M.D.   On: 10/11/2016 16:28   Dg Chest Portable 1 View  Result Date: 10/10/2016 CLINICAL DATA:  Chest pain and nausea EXAM: PORTABLE CHEST 1 VIEW COMPARISON:  None. FINDINGS: Cardiomegaly is noted with aortic atherosclerosis. Lungs are clear without effusion, pulmonary consolidation or pneumothorax. No acute osseous abnormality. IMPRESSION: No active disease.  Cardiomegaly with aortic atherosclerosis. Electronically Signed   By: Ashley Royalty M.D.   On: 10/10/2016 20:00     CBC  Recent  Labs Lab 10/10/16 1930 10/11/16 0117  WBC 8.5 9.3  HGB 15.1 14.3  HCT 45.0 41.6  PLT 109* 117*  MCV 92.3 90.3  MCH 30.9 30.9  MCHC 33.5 34.3  RDW 13.9 13.6    Chemistries   Recent Labs Lab 10/10/16 1930 10/11/16 0117  NA 140 139  K 4.0 4.5  CL 106 110  CO2 24 23  GLUCOSE 138* 124*  BUN 16 16  CREATININE 1.53* 1.34*  CALCIUM 8.8* 8.3*  MG 1.8  --   AST 21  --   ALT 14*  --   ALKPHOS 45  --   BILITOT 1.1  --    ------------------------------------------------------------------------------------------------------------------ estimated creatinine clearance is 51.5 mL/min (A) (by C-G formula based on SCr of 1.34 mg/dL (H)). ------------------------------------------------------------------------------------------------------------------ No results for input(s): HGBA1C in the last 72 hours. ------------------------------------------------------------------------------------------------------------------  Recent Labs  10/11/16 0117  CHOL 122  HDL 28*  LDLCALC 67  TRIG 133  CHOLHDL 4.4   ------------------------------------------------------------------------------------------------------------------  Recent Labs  10/10/16 1930  TSH 15.367*   ------------------------------------------------------------------------------------------------------------------ No results for input(s): VITAMINB12, FOLATE, FERRITIN, TIBC, IRON, RETICCTPCT in the last 72 hours.  Coagulation profile  Recent Labs Lab 10/11/16 0251  INR 1.46    No results for input(s): DDIMER in the last 72 hours.  Cardiac Enzymes  Recent Labs Lab 10/11/16 0117 10/11/16 0822 10/11/16 1424  TROPONINI 0.58* 0.38* 0.25*   ------------------------------------------------------------------------------------------------------------------ Invalid input(s): POCBNP    Assessment & Plan   This is a 76 y.o. male with a history of yperlipidemia and hypothyroidism now being admitted with:  #.  Syncope likely cardiogenic secondary to arrhythmia Echocardiogram with no significant valvular disorder Carotid Doppler showed no significant stenosis  #. Non-ST MI -status post cardiac catheter with stent placement - asa therapy  - brilinta - beta blocker on discharge if heart rate and blood pressure tolerated   #. Acute kidney injury  Repeat BMP in the morning  #. History of hyperlipidemia Continue Lipitor  #. History of BPH - Continue Proscar  #. History of hypothyroidism - TSH is elevated however free T4 also elevatedat this point I would keep the same dosage  #. History of GERD     Code Status Orders  Start     Ordered   10/10/16 2353  Full code  Continuous     10/10/16 2352    Code Status History    Date Active Date Inactive Code Status Order ID Comments User Context   This patient has a current code status but no historical code status.    Advance Directive Documentation     Most Recent Value  Type of Advance Directive  Living will  Pre-existing out of facility DNR order (yellow form or pink MOST form)  -  "MOST" Form in Place?  -           Consults  Cardiology and neurology  DVT Prophylaxis heparin drip  Lab Results  Component Value Date   PLT 117 (L) 10/11/2016     Time Spent in minutes   32 minutes  Greater than 50% of time spent in care coordination and counseling patient regarding the condition and plan of care.   Dustin Flock M.D on 10/12/2016 at 12:58 PM  Between 7am to 6pm - Pager - [phone]  After 6pm go to [email or web address] - password [email or web address] [facility]   Office  [phone]

## 2016-10-12 NOTE — Progress Notes (Signed)
PT Cancellation Note  Patient Details Name: Keymarion Bearman MRN: [id number] DOB: 1940/08/26   Cancelled Treatment:    Reason Eval/Treat Not Completed: Patient at procedure or test/unavailable Pt has been out of room all morning, will continue to look in and see as time allows and as appropriate.   Kreg Shropshire, DPT 10/12/2016, 11:39 AM

## 2016-10-12 NOTE — Evaluation (Signed)
Physical Therapy Evaluation Patient Details Name: Christopher Carlson MRN: [id number] DOB: 10/24/1940 Today's Date: 10/12/2016   History of Present Illness  76 y.o. male with a known history of hyperlipidemia, hypothyroidism here after syncopal episode.  He had brief loss of consciousness preceded by nausea and diaphoresis. He regained consciousness spontaneously and complains of weakness, dizziness and fatigue.  Elevated troponins with NSTEMI, had stent placed this AM.  Clinical Impression  Pt did well with mobility, balance, ambulation and overall confidence with activity.  His O2 remained in the high 90s on room air and though he c/o some fatigue he was able to easily circumambulate the nurses' station safely.  Family present and agrees that they will insure that he stays active when he has recovered and agree with PT that HHPT or other follow up is not appropriate.     Follow Up Recommendations No PT follow up    Equipment Recommendations  None recommended by PT    Recommendations for Other Services       Precautions / Restrictions Precautions Precautions: None Restrictions Weight Bearing Restrictions: No      Mobility  Bed Mobility Overal bed mobility: Independent             General bed mobility comments: Pt able to get up to EOB w/o assist, good confidence  Transfers Overall transfer level: Independent               General transfer comment: Pt able to rise to standing w/o assist, good balance w/o AD  Ambulation/Gait Ambulation/Gait assistance: Modified independent (Device/Increase time) Ambulation Distance (Feet): 200 Feet Assistive device: None       General Gait Details: Pt with slow but safe and consistent cadence.  Pt with some fatigue with the prolonged bout of ambulation but O2 and HR remained stable.  Family reports he is walking slower than his baseline.  Stairs            Wheelchair Mobility    Modified Rankin (Stroke Patients Only)        Balance Overall balance assessment: Modified Independent                                           Pertinent Vitals/Pain Pain Assessment: No/denies pain    Home Living Family/patient expects to be discharged to:: Private residence Living Arrangements: Children Available Help at Discharge: Family   Home Access:  (1 step enter)       Home Equipment: Environmental consultant - 2 wheels;Cane - single point      Prior Function Level of Independence: Independent         Comments: Pt generally has been able to be active     Hand Dominance        Extremity/Trunk Assessment   Upper Extremity Assessment Upper Extremity Assessment: Overall WFL for tasks assessed    Lower Extremity Assessment Lower Extremity Assessment: Overall WFL for tasks assessed       Communication   Communication: No difficulties  Cognition Arousal/Alertness: Awake/alert Behavior During Therapy: WFL for tasks assessed/performed Overall Cognitive Status: Within Functional Limits for tasks assessed                                        General Comments      Exercises  Assessment/Plan    PT Assessment Patent does not need any further PT services  PT Problem List         PT Treatment Interventions      PT Goals (Current goals can be found in the Care Plan section)  Acute Rehab PT Goals Patient Stated Goal: go home PT Goal Formulation: All assessment and education complete, DC therapy    Frequency  (eval only)   Barriers to discharge        Co-evaluation               AM-PAC PT "6 Clicks" Daily Activity  Outcome Measure Difficulty turning over in bed (including adjusting bedclothes, sheets and blankets)?: None Difficulty moving from lying on back to sitting on the side of the bed? : None Difficulty sitting down on and standing up from a chair with arms (e.g., wheelchair, bedside commode, etc,.)?: None Help needed moving to and from a bed to  chair (including a wheelchair)?: None Help needed walking in hospital room?: None Help needed climbing 3-5 steps with a railing? : None 6 Click Score: 24    End of Session Equipment Utilized During Treatment: Gait belt Activity Tolerance: Patient tolerated treatment well Patient left: in chair;with call bell/phone within reach;with family/visitor present Nurse Communication: Mobility status PT Visit Diagnosis: Muscle weakness (generalized) (M62.81);Difficulty in walking, not elsewhere classified (R26.2)    Time: 8341-9622 PT Time Calculation (min) (ACUTE ONLY): 19 min   Charges:   PT Evaluation $PT Eval Low Complexity: 1 Low     PT G Codes:   PT G-Codes **NOT FOR INPATIENT CLASS** Functional Assessment Tool Used: AM-PAC 6 Clicks Basic Mobility Functional Limitation: Mobility: Walking and moving around Mobility: Walking and Moving Around Current Status (W9798): 0 percent impaired, limited or restricted Mobility: Walking and Moving Around Goal Status (X2119): 0 percent impaired, limited or restricted Mobility: Walking and Moving Around Discharge Status (E1740): 0 percent impaired, limited or restricted    Kreg Shropshire, DPT 10/12/2016, 2:34 PM

## 2016-10-13 LAB — BASIC METABOLIC PANEL
ANION GAP: 6 (ref 5–15)
BUN: 13 mg/dL (ref 6–20)
CHLORIDE: 111 mmol/L (ref 101–111)
CO2: 22 mmol/L (ref 22–32)
Calcium: 8.7 mg/dL — ABNORMAL LOW (ref 8.9–10.3)
Creatinine, Ser: 0.96 mg/dL (ref 0.61–1.24)
GFR calc Af Amer: >60 mL/min (ref >60)
GLUCOSE: 91 mg/dL (ref 65–99)
POTASSIUM: 3.8 mmol/L (ref 3.5–5.1)
Sodium: 139 mmol/L (ref 135–145)

## 2016-10-13 LAB — GLUCOSE, CAPILLARY: GLUCOSE-CAPILLARY: 82 mg/dL (ref 65–99)

## 2016-10-13 LAB — CBC
HCT: 43.1 % (ref 40.0–52.0)
HEMOGLOBIN: 14.8 g/dL (ref 13.0–18.0)
MCH: 31.5 pg (ref 26.0–34.0)
MCHC: 34.5 g/dL (ref 32.0–36.0)
MCV: 91.5 fL (ref 80.0–100.0)
Platelets: 139 10*3/uL — ABNORMAL LOW (ref 150–440)
RBC: 4.71 MIL/uL (ref 4.40–5.90)
RDW: 14.2 % (ref 11.5–14.5)
WBC: 8.3 10*3/uL (ref 3.8–10.6)

## 2016-10-13 MED ORDER — TICAGRELOR 90 MG PO TABS: 90.0000 mg | ORAL_TABLET | Freq: Two times a day (BID) | ORAL | 0 refills | Status: DC

## 2016-10-13 MED ORDER — NITROGLYCERIN 0.4 MG SL SUBL: 0.4000 mg | SUBLINGUAL_TABLET | SUBLINGUAL | 0 refills | Status: AC | PRN

## 2016-10-13 MED ORDER — ASPIRIN 81 MG PO TBEC: 81.0000 mg | DELAYED_RELEASE_TABLET | Freq: Every day | ORAL | Status: DC

## 2016-10-13 NOTE — Discharge Summary (Addendum)
[facility] NAME: Christopher Carlson    MR#:  [id number]  DATE OF BIRTH:  [birth year]  DATE OF ADMISSION:  10/10/2016 ADMITTING PHYSICIAN: Harvie Bridge, DO  DATE OF DISCHARGE: 10/13/2016  PRIMARY CARE PHYSICIAN: Maryland Pink, MD    ADMISSION DIAGNOSIS:  Bigeminy [I49.9] Syncope, unspecified syncope type [R55]  DISCHARGE DIAGNOSIS:  Active Problems:   Syncope   SECONDARY DIAGNOSIS:   Past Medical History:  Diagnosis Date  . High cholesterol   . Thyroid disease     HOSPITAL COURSE:   76 y.o. male with a history of Hyperlipidemia and hypothyroidism admitted with syncope and elevated troponin.  1.Syncope due to Non-ST elevation MI: Patient was admitted to telemetry. Troponins were elevated and he was found to have a non-ST elevation MI. Cardiology was consulted. Patient underwent cardiac catheterization showing Conclusion Mildly depressed left ventricular function ejection fraction 45% Anterior apical mild hypokinesis Significant mid LAD lesion 90% moderate calcification at a bifurcation Large left dominant circumflex relatively free of disease Nondominant RCA Successful PCI and stent of mid LAD with DES reducing the lesion from 90 down to 0%. His echocardiogram showed normal ejection fraction with grade 1 diastolic dysfunction Carotid Doppler showed no hemodynamically significant stenosis.  Patient is doing well status post drug-eluting stent. He will continue aspirin, statin and Brillanta.  Unfortunately due to Carlson blood pressure and heart rate we are unable to start at this time beta blocker. He is referred to cardiac rehabilitation at discharge and follow-up with cardiology in 1 week.  2. Acute kidney injury: This is improved  3. Hyperlipidemia: Patient will continue on statin  4. The pH: Patient continue Proscar  5. Hypothyroidism: TSH was elevated however free T4 was also elevated indicating possible sick  euthyroid syndrome Please repeat TFTs in 4 weeks.   DISCHARGE CONDITIONS AND DIET:   Stable for discharge on heart healthy diet  CONSULTS OBTAINED:  Treatment Team:  Leotis Pain, MD  DRUG ALLERGIES:   Allergies  Allergen Reactions  . Ciprofloxacin Rash    DISCHARGE MEDICATIONS:   Current Discharge Medication List    START taking these medications   Details  aspirin EC 81 MG EC tablet Take 1 tablet (81 mg total) by mouth daily.    nitroGLYCERIN (NITROSTAT) 0.4 MG SL tablet Place 1 tablet (0.4 mg total) under the tongue every 5 (five) minutes as needed for chest pain. Qty: 30 tablet, Refills: 0    ticagrelor (BRILINTA) 90 MG TABS tablet Take 1 tablet (90 mg total) by mouth 2 (two) times daily. Qty: 60 tablet, Refills: 0      CONTINUE these medications which have NOT CHANGED   Details  atorvastatin (LIPITOR) 40 MG tablet Take 40 mg by mouth daily.    clonazePAM (KLONOPIN) 1 MG tablet Take 0.5 mg by mouth at bedtime.     finasteride (PROSCAR) 5 MG tablet Take 5 mg by mouth daily.    levothyroxine (SYNTHROID, LEVOTHROID) 112 MCG tablet Take 112 mcg by mouth daily.    pantoprazole (PROTONIX) 40 MG tablet Take 40 mg by mouth daily.    QUEtiapine (SEROQUEL) 50 MG tablet Take 50 mg by mouth at bedtime.          Today   CHIEF COMPLAINT:   Doing well this morning. Ambien related without chest pain.   VITAL SIGNS:  Blood pressure 135/79, pulse (!) 59, temperature 97.7 F (36.5 C), temperature source Oral, resp. rate 17, height 6' (1.829 m),  weight 86.7 kg (191 lb 1.6 oz), SpO2 94 %.   REVIEW OF SYSTEMS:  Review of Systems  Constitutional: Negative.  Negative for chills, fever and malaise/fatigue.  HENT: Negative.  Negative for ear discharge, ear pain, hearing loss, nosebleeds and sore throat.   Eyes: Negative.  Negative for blurred vision and pain.  Respiratory: Negative.  Negative for cough, hemoptysis, shortness of breath and wheezing.    Cardiovascular: Negative.  Negative for chest pain, palpitations and leg swelling.  Gastrointestinal: Negative.  Negative for abdominal pain, blood in stool, diarrhea, nausea and vomiting.  Genitourinary: Negative.  Negative for dysuria.  Musculoskeletal: Negative.  Negative for back pain.  Skin: Negative.   Neurological: Negative for dizziness, tremors, speech change, focal weakness, seizures and headaches.  Endo/Heme/Allergies: Negative.  Does not bruise/bleed easily.  Psychiatric/Behavioral: Negative.  Negative for depression, hallucinations and suicidal ideas.     PHYSICAL EXAMINATION:  GENERAL:  76 y.o.-year-old patient lying in the bed with no acute distress.  NECK:  Supple, no jugular venous distention. No thyroid enlargement, no tenderness.  LUNGS: Normal breath sounds bilaterally, no wheezing, rales,rhonchi  No use of accessory muscles of respiration.  CARDIOVASCULAR: S1, S2 normal. No murmurs, rubs, or gallops.  ABDOMEN: Soft, non-tender, non-distended. Bowel sounds present. No organomegaly or mass.  EXTREMITIES: No pedal edema, cyanosis, or clubbing.  PSYCHIATRIC: The patient is alert and oriented x 3.  SKIN: No obvious rash, lesion, or ulcer.   DATA REVIEW:   CBC  Recent Labs Lab 10/13/16 0621  WBC 8.3  HGB 14.8  HCT 43.1  PLT 139*    Chemistries   Recent Labs Lab 10/10/16 1930  10/13/16 0621  NA 140  < > 139  K 4.0  < > 3.8  CL 106  < > 111  CO2 24  < > 22  GLUCOSE 138*  < > 91  BUN 16  < > 13  CREATININE 1.53*  < > 0.96  CALCIUM 8.8*  < > 8.7*  MG 1.8  --   --   AST 21  --   --   ALT 14*  --   --   ALKPHOS 45  --   --   BILITOT 1.1  --   --   < > = values in this interval not displayed.  Cardiac Enzymes  Recent Labs Lab 10/11/16 0117 10/11/16 0822 10/11/16 1424  TROPONINI 0.58* 0.38* 0.25*    Microbiology Results  @MICRORSLT48 @  RADIOLOGY:  US Carotid Bilateral  Result Date: 10/11/2016 CLINICAL DATA:  76 year old male with a  history of syncope. Cardiovascular risk factors include hyperlipidemia EXAM: BILATERAL CAROTID DUPLEX ULTRASOUND TECHNIQUE: Pearline Cables scale imaging, color Doppler and duplex ultrasound were performed of bilateral carotid and vertebral arteries in the neck. COMPARISON:  No prior FINDINGS: Criteria: Quantification of carotid stenosis is based on velocity parameters that correlate the residual internal carotid diameter with NASCET-based stenosis levels, using the diameter of the distal internal carotid lumen as the denominator for stenosis measurement. The following velocity measurements were obtained: RIGHT ICA:  Systolic 69 cm/sec, Diastolic 6 cm/sec CCA:  1 Hunt cm/sec SYSTOLIC ICA/CCA RATIO:  0.7 ECA:  93 cm/sec LEFT ICA:  Systolic 69 cm/sec, Diastolic 12 cm/sec CCA:  98 cm/sec SYSTOLIC ICA/CCA RATIO:  0.7 ECA:  74 cm/sec Right Brachial SBP: Not acquired Left Brachial SBP: Not acquired RIGHT CAROTID ARTERY: No significant calcifications of the right common carotid artery. Intermediate waveform maintained. Heterogeneous and partially calcified plaque at the right carotid bifurcation.  No significant lumen shadowing. Carlson resistance waveform of the right ICA. No significant tortuosity. RIGHT VERTEBRAL ARTERY: Antegrade flow with Carlson resistance waveform. LEFT CAROTID ARTERY: No significant calcifications of the left common carotid artery. Intermediate waveform maintained. Heterogeneous and partially calcified plaque at the left carotid bifurcation without significant lumen shadowing. Carlson resistance waveform of the left ICA. No significant tortuosity. LEFT VERTEBRAL ARTERY:  Antegrade flow with Carlson resistance waveform. IMPRESSION: Color duplex indicates minimal heterogeneous and calcified plaque, with no hemodynamically significant stenosis by duplex criteria in the extracranial cerebrovascular circulation. Signed, Dulcy Fanny. Earleen Newport, DO [facility] Electronically Signed    By: Corrie Mckusick D.O.   On: 10/11/2016 10:01   Nm Pulmonary Perf And Vent  Result Date: 10/11/2016 CLINICAL DATA:  Syncopal episode, hypoxia, question pulmonary embolism, elevated troponins EXAM: NUCLEAR MEDICINE VENTILATION - PERFUSION LUNG SCAN TECHNIQUE: Ventilation images were obtained in multiple projections using inhaled aerosol Tc-4m DTPA. Perfusion images were obtained in multiple projections after intravenous injection of Tc-76m MAA. RADIOPHARMACEUTICALS:  28.747 mCi Technetium-43m DTPA aerosol inhalation and 4.102 mCi Technetium-104m MAA IV COMPARISON:  None Radiographic correlation:  Chest radiograph 10/10/2016 FINDINGS: Ventilation: Mild central airway deposition of aerosol. Subsegmental ventilation defect at LEFT lower lobe base. Diminished ventilation posterior RIGHT lower lobe base. Perfusion: Small subsegmental perfusion defect at base of LEFT lower lobe, matching. Normal perfusion to RIGHT lower lobe. Subsegmental perfusion defects at lateral RIGHT upper lobe and in lingula. Chest radiograph:  No acute infiltrate IMPRESSION: Carlson probability for pulmonary embolism. Electronically Signed   By: Lavonia Dana M.D.   On: 10/11/2016 16:28      Current Discharge Medication List    START taking these medications   Details  aspirin EC 81 MG EC tablet Take 1 tablet (81 mg total) by mouth daily.    nitroGLYCERIN (NITROSTAT) 0.4 MG SL tablet Place 1 tablet (0.4 mg total) under the tongue every 5 (five) minutes as needed for chest pain. Qty: 30 tablet, Refills: 0    ticagrelor (BRILINTA) 90 MG TABS tablet Take 1 tablet (90 mg total) by mouth 2 (two) times daily. Qty: 60 tablet, Refills: 0      CONTINUE these medications which have NOT CHANGED   Details  atorvastatin (LIPITOR) 40 MG tablet Take 40 mg by mouth daily.    clonazePAM (KLONOPIN) 1 MG tablet Take 0.5 mg by mouth at bedtime.     finasteride (PROSCAR) 5 MG tablet Take 5 mg by mouth daily.    levothyroxine (SYNTHROID,  LEVOTHROID) 112 MCG tablet Take 112 mcg by mouth daily.    pantoprazole (PROTONIX) 40 MG tablet Take 40 mg by mouth daily.    QUEtiapine (SEROQUEL) 50 MG tablet Take 50 mg by mouth at bedtime.         Aspirin prescribed at discharge?  Yes High Intensity Statin Prescribed? (Lipitor 40-80mg  or Crestor 20-40mg ): Yes Beta Blocker Prescribed? No bp and HR Carlson For EF <40%, was ACEI/ARB Prescribed? No: bp Carlson ADP Receptor Inhibitor Prescribed? (i.e. Plavix etc.-Includes Medically Managed Patients): yes For EF <40%, Aldosterone Inhibitor Prescribed? No: not indicated Was EF assessed during THIS hospitalization? Yes Was Cardiac Rehab II ordered? (Included Medically managed Patients): Yes   Management plans discussed with the patient and He  is in agreement. Stable for discharge home  Patient should follow up with cardiology  CODE STATUS:     Code Status Orders        Start     Ordered  10/10/16 2353  Full code  Continuous     10/10/16 2352    Code Status History    Date Active Date Inactive Code Status Order ID Comments User Context   This patient has a current code status but no historical code status.    Advance Directive Documentation     Most Recent Value  Type of Advance Directive  Living will  Pre-existing out of facility DNR order (yellow form or pink MOST form)  -  "MOST" Form in Place?  -      TOTAL TIME TAKING CARE OF THIS PATIENT: 37 minutes.    Note: This dictation was prepared with Dragon dictation along with smaller phrase technology. Any transcriptional errors that result from this process are unintentional.  Umberto Pavek M.D on 10/13/2016 at 8:47 AM  Between 7am to 6pm - Pager - [phone] After 6pm go to [email or web address] - password [email or web address] [facility]  Office  [phone]  CC: Primary care physician; Maryland Pink, MD

## 2016-10-22 DIAGNOSIS — I10 Essential (primary) hypertension: Secondary | ICD-10-CM | POA: Diagnosis not present

## 2016-10-22 DIAGNOSIS — R55 Syncope and collapse: Secondary | ICD-10-CM | POA: Diagnosis not present

## 2016-10-22 DIAGNOSIS — K219 Gastro-esophageal reflux disease without esophagitis: Secondary | ICD-10-CM | POA: Diagnosis not present

## 2016-10-22 DIAGNOSIS — E782 Mixed hyperlipidemia: Secondary | ICD-10-CM | POA: Diagnosis not present

## 2016-10-22 DIAGNOSIS — I200 Unstable angina: Secondary | ICD-10-CM | POA: Diagnosis not present

## 2016-10-22 DIAGNOSIS — G4700 Insomnia, unspecified: Secondary | ICD-10-CM | POA: Diagnosis not present

## 2016-10-22 DIAGNOSIS — I2510 Atherosclerotic heart disease of native coronary artery without angina pectoris: Secondary | ICD-10-CM | POA: Diagnosis not present

## 2016-10-22 DIAGNOSIS — I214 Non-ST elevation (NSTEMI) myocardial infarction: Secondary | ICD-10-CM | POA: Diagnosis not present

## 2016-10-22 DIAGNOSIS — Z959 Presence of cardiac and vascular implant and graft, unspecified: Secondary | ICD-10-CM | POA: Diagnosis not present

## 2016-10-23 DIAGNOSIS — R110 Nausea: Secondary | ICD-10-CM | POA: Diagnosis not present

## 2016-10-23 DIAGNOSIS — I2510 Atherosclerotic heart disease of native coronary artery without angina pectoris: Secondary | ICD-10-CM | POA: Diagnosis not present

## 2016-10-25 ENCOUNTER — Ambulatory Visit: Payer: Medicare HMO

## 2016-12-27 DIAGNOSIS — N401 Enlarged prostate with lower urinary tract symptoms: Secondary | ICD-10-CM | POA: Diagnosis not present

## 2016-12-27 DIAGNOSIS — R310 Gross hematuria: Secondary | ICD-10-CM | POA: Diagnosis not present

## 2016-12-27 DIAGNOSIS — Z87442 Personal history of urinary calculi: Secondary | ICD-10-CM | POA: Diagnosis not present

## 2016-12-27 DIAGNOSIS — R351 Nocturia: Secondary | ICD-10-CM | POA: Diagnosis not present

## 2016-12-27 DIAGNOSIS — N390 Urinary tract infection, site not specified: Secondary | ICD-10-CM | POA: Diagnosis not present

## 2016-12-27 DIAGNOSIS — R300 Dysuria: Secondary | ICD-10-CM | POA: Diagnosis not present

## 2017-01-11 ENCOUNTER — Encounter: Payer: Self-pay | Admitting: Urology

## 2017-01-11 ENCOUNTER — Ambulatory Visit: Payer: Medicare HMO | Admitting: Urology

## 2017-01-11 VITALS — BP 152/66 | HR 81 | Ht 72.0 in | Wt 185.8 lb

## 2017-01-11 DIAGNOSIS — R310 Gross hematuria: Secondary | ICD-10-CM | POA: Diagnosis not present

## 2017-01-11 LAB — MICROSCOPIC EXAMINATION
Epithelial Cells (non renal): NONE SEEN /hpf (ref 0–10)
RBC, UA: NONE SEEN /hpf (ref 0–?)
WBC UA: NONE SEEN /HPF (ref 0–?)

## 2017-01-11 LAB — URINALYSIS, COMPLETE
Bilirubin, UA: NEGATIVE
GLUCOSE, UA: NEGATIVE
Ketones, UA: NEGATIVE
LEUKOCYTES UA: NEGATIVE
Nitrite, UA: NEGATIVE
PH UA: 6 (ref 5.0–7.5)
PROTEIN UA: NEGATIVE
Specific Gravity, UA: 1.015 (ref 1.005–1.030)
UUROB: 0.2 mg/dL (ref 0.2–1.0)

## 2017-01-11 NOTE — Progress Notes (Signed)
01/11/2017 9:33 AM   Christopher Carlson Jun 03, 1940 [id number]  Referring provider: Maryland Pink, MD [address] [facility] [place], [place] [address]  Chief Complaint  Patient presents with  . Hematuria    HPI: The patient is a 76 year old gentleman with possible history of BPH on finasteride who presents today for gross hematuria.  This has happened multiple times over the last few years.  He had seen a urologist in [place] for this before.  This urologist did perform either a CT or MRI per the patient.  Despite his recurrent gross hematuria, he has never had cystoscopy.  His urologist in [place] prescribed him large amounts of Cipro which he would take whenever he saw blood in his urine.  He has had nephrolithiasis in the past.  He has had lithotripsy x2.  He continues to have intermittent gross hematuria at this time.  He currently has no symptoms other than gross hematuria.  He reports no dysuria.  He reports no fevers.  He reports no problems voiding per  Of note, he is on Brilinta and baby aspirin due to MI in September 2018.   PMH: Past Medical History:  Diagnosis Date  . High cholesterol   . Thyroid disease     Surgical History: Past Surgical History:  Procedure Laterality Date  . CORONARY STENT INTERVENTION N/A 10/12/2016   Procedure: CORONARY STENT INTERVENTION;  Surgeon: Yolonda Kida, MD;  Location: [facility];  Service: Cardiovascular;  Laterality: N/A;  . HERNIA REPAIR    . LEFT HEART CATH AND CORONARY ANGIOGRAPHY N/A 10/12/2016   Procedure: LEFT HEART CATH AND CORONARY ANGIOGRAPHY and possible pci;  Surgeon: Yolonda Kida, MD;  Location: [facility];  Service: Cardiovascular;  Laterality: N/A;    Home Medications:  Allergies as of 01/11/2017      Reactions   Ciprofloxacin Rash      Medication List        Accurate as of 01/11/17  9:33 AM. Always use your most recent med list.          aspirin 81 MG EC tablet Take 1  tablet (81 mg total) by mouth daily.   atorvastatin 40 MG tablet Commonly known as:  LIPITOR Take 40 mg by mouth daily.   clonazePAM 1 MG tablet Commonly known as:  KLONOPIN Take 0.5 mg by mouth at bedtime.   finasteride 5 MG tablet Commonly known as:  PROSCAR Take 5 mg by mouth daily.   levothyroxine 112 MCG tablet Commonly known as:  SYNTHROID, LEVOTHROID Take 112 mcg by mouth daily.   nitroGLYCERIN 0.4 MG SL tablet Commonly known as:  NITROSTAT Place 1 tablet (0.4 mg total) under the tongue every 5 (five) minutes as needed for chest pain.   pantoprazole 40 MG tablet Commonly known as:  PROTONIX Take 40 mg by mouth daily.   QUEtiapine 50 MG tablet Commonly known as:  SEROQUEL Take 50 mg by mouth at bedtime.   ticagrelor 90 MG Tabs tablet Commonly known as:  BRILINTA Take 1 tablet (90 mg total) by mouth 2 (two) times daily.       Allergies:  Allergies  Allergen Reactions  . Ciprofloxacin Rash    Family History: Family History  Problem Relation Age of Onset  . Prostate cancer Neg Hx   . Bladder Cancer Neg Hx   . Kidney cancer Neg Hx     Social History:  reports that  has never smoked. he has never used  smokeless tobacco. He reports that he does not drink alcohol or use drugs.  ROS: UROLOGY Frequent Urination?: Yes Hard to postpone urination?: Yes Burning/pain with urination?: No Get up at night to urinate?: Yes Leakage of urine?: No Urine stream starts and stops?: No Trouble starting stream?: No Do you have to strain to urinate?: No Blood in urine?: Yes Urinary tract infection?: Yes Sexually transmitted disease?: No Injury to kidneys or bladder?: No Painful intercourse?: No Weak stream?: No Erection problems?: No Penile pain?: No  Gastrointestinal Nausea?: No Vomiting?: No Indigestion/heartburn?: Yes Diarrhea?: No Constipation?: Yes  Constitutional Fever: No Night sweats?: No Weight loss?: No Fatigue?: No  Skin Skin rash/lesions?:  No Itching?: Yes  Eyes Blurred vision?: No Double vision?: No  Ears/Nose/Throat Sore throat?: No Sinus problems?: No  Hematologic/Lymphatic Swollen glands?: No Easy bruising?: Yes  Cardiovascular Leg swelling?: No Chest pain?: No  Respiratory Cough?: No Shortness of breath?: Yes  Endocrine Excessive thirst?: No  Musculoskeletal Back pain?: No Joint pain?: Yes  Neurological Headaches?: No Dizziness?: No  Psychologic Depression?: Yes Anxiety?: Yes  Physical Exam: BP (!) 152/66 (BP Location: Right Arm, Patient Position: Sitting, Cuff Size: Normal)   Pulse 81   Ht 6' (1.829 m)   Wt 185 lb 12.8 oz (84.3 kg)   BMI 25.20 kg/m   Constitutional:  Alert and oriented, No acute distress. HEENT: [place] AT, moist mucus membranes.  Trachea midline, no masses. Cardiovascular: No clubbing, cyanosis, or edema. Respiratory: Normal respiratory effort, no increased work of breathing. GI: Abdomen is soft, nontender, nondistended, no abdominal masses GU: No CVA tenderness.  Skin: No rashes, bruises or suspicious lesions. Lymph: No cervical or inguinal adenopathy. Neurologic: Grossly intact, no focal deficits, moving all 4 extremities. Psychiatric: Normal mood and affect.  Laboratory Data: Lab Results  Component Value Date   WBC 8.3 10/13/2016   HGB 14.8 10/13/2016   HCT 43.1 10/13/2016   MCV 91.5 10/13/2016   PLT 139 (L) 10/13/2016    Lab Results  Component Value Date   CREATININE 0.96 10/13/2016    No results found for: PSA  No results found for: TESTOSTERONE  No results found for: HGBA1C  Urinalysis    Component Value Date/Time   COLORURINE YELLOW (A) 07/26/2016 0736   APPEARANCEUR CLEAR (A) 07/26/2016 0736   LABSPEC 1.009 07/26/2016 0736   PHURINE 6.0 07/26/2016 0736   GLUCOSEU NEGATIVE 07/26/2016 0736   HGBUR LARGE (A) 07/26/2016 0736   BILIRUBINUR NEGATIVE 07/26/2016 0736   KETONESUR NEGATIVE 07/26/2016 0736   PROTEINUR NEGATIVE 07/26/2016 0736    NITRITE NEGATIVE 07/26/2016 0736   LEUKOCYTESUR TRACE (A) 07/26/2016 0736    Assessment & Plan:    1. Gross hematuria I discussed with the patient that the standard of care for any patient with gross hematuria with a CT urogram followed by office cystoscopy.  I am not sure why his previous urologist has not performed office cystoscopy previously for this problem.  We did discuss that antibiotics are not the appropriate treatment for intermittent gross hematuria.  I recommended that he not take any antibiotics unless he has symptoms of a urinary tract infection as well as a positive urine culture.  We will arrange for him to undergo CT urogram followed by office cystoscopy.  Return for after CT for cysto.  Nickie Retort, MD  [facility] [address], [place], [place] [address] [phone]

## 2017-02-01 ENCOUNTER — Telehealth: Payer: Self-pay

## 2017-02-01 ENCOUNTER — Encounter: Payer: Self-pay | Admitting: Emergency Medicine

## 2017-02-01 ENCOUNTER — Ambulatory Visit
Admission: RE | Admit: 2017-02-01 | Discharge: 2017-02-01 | Disposition: A | Discharge status: Home / Self Care | Payer: Medicare HMO | Source: Ambulatory Visit | Attending: Urology | Admitting: Urology

## 2017-02-01 ENCOUNTER — Other Ambulatory Visit: Payer: Self-pay

## 2017-02-01 ENCOUNTER — Inpatient Hospital Stay
Admission: EM | Admit: 2017-02-01 | Discharge: 2017-02-03 | DRG: 176 | Disposition: A | Discharge status: Home / Self Care | Payer: Medicare HMO | Attending: Internal Medicine | Admitting: Internal Medicine

## 2017-02-01 DIAGNOSIS — Z955 Presence of coronary angioplasty implant and graft: Secondary | ICD-10-CM

## 2017-02-01 DIAGNOSIS — I2699 Other pulmonary embolism without acute cor pulmonale: Secondary | ICD-10-CM | POA: Diagnosis present

## 2017-02-01 DIAGNOSIS — Z79899 Other long term (current) drug therapy: Secondary | ICD-10-CM

## 2017-02-01 DIAGNOSIS — I2510 Atherosclerotic heart disease of native coronary artery without angina pectoris: Secondary | ICD-10-CM | POA: Diagnosis not present

## 2017-02-01 DIAGNOSIS — E039 Hypothyroidism, unspecified: Secondary | ICD-10-CM | POA: Diagnosis present

## 2017-02-01 DIAGNOSIS — Z7982 Long term (current) use of aspirin: Secondary | ICD-10-CM | POA: Diagnosis not present

## 2017-02-01 DIAGNOSIS — Z87891 Personal history of nicotine dependence: Secondary | ICD-10-CM | POA: Diagnosis not present

## 2017-02-01 DIAGNOSIS — K219 Gastro-esophageal reflux disease without esophagitis: Secondary | ICD-10-CM | POA: Diagnosis present

## 2017-02-01 DIAGNOSIS — Z809 Family history of malignant neoplasm, unspecified: Secondary | ICD-10-CM | POA: Diagnosis not present

## 2017-02-01 DIAGNOSIS — Z87442 Personal history of urinary calculi: Secondary | ICD-10-CM

## 2017-02-01 DIAGNOSIS — Z7902 Long term (current) use of antithrombotics/antiplatelets: Secondary | ICD-10-CM

## 2017-02-01 DIAGNOSIS — E7800 Pure hypercholesterolemia, unspecified: Secondary | ICD-10-CM | POA: Diagnosis not present

## 2017-02-01 DIAGNOSIS — N400 Enlarged prostate without lower urinary tract symptoms: Secondary | ICD-10-CM | POA: Diagnosis not present

## 2017-02-01 DIAGNOSIS — Z881 Allergy status to other antibiotic agents status: Secondary | ICD-10-CM

## 2017-02-01 DIAGNOSIS — I2609 Other pulmonary embolism with acute cor pulmonale: Secondary | ICD-10-CM | POA: Diagnosis not present

## 2017-02-01 DIAGNOSIS — R0602 Shortness of breath: Secondary | ICD-10-CM | POA: Diagnosis not present

## 2017-02-01 DIAGNOSIS — N200 Calculus of kidney: Secondary | ICD-10-CM | POA: Diagnosis present

## 2017-02-01 DIAGNOSIS — I82409 Acute embolism and thrombosis of unspecified deep veins of unspecified lower extremity: Secondary | ICD-10-CM

## 2017-02-01 DIAGNOSIS — Z79890 Hormone replacement therapy (postmenopausal): Secondary | ICD-10-CM | POA: Diagnosis not present

## 2017-02-01 DIAGNOSIS — I82432 Acute embolism and thrombosis of left popliteal vein: Secondary | ICD-10-CM | POA: Diagnosis not present

## 2017-02-01 DIAGNOSIS — R319 Hematuria, unspecified: Secondary | ICD-10-CM | POA: Diagnosis not present

## 2017-02-01 DIAGNOSIS — Z811 Family history of alcohol abuse and dependence: Secondary | ICD-10-CM

## 2017-02-01 DIAGNOSIS — R310 Gross hematuria: Secondary | ICD-10-CM

## 2017-02-01 DIAGNOSIS — E785 Hyperlipidemia, unspecified: Secondary | ICD-10-CM | POA: Diagnosis present

## 2017-02-01 DIAGNOSIS — Z9861 Coronary angioplasty status: Secondary | ICD-10-CM | POA: Diagnosis not present

## 2017-02-01 HISTORY — DX: Atherosclerotic heart disease of native coronary artery without angina pectoris: I25.10

## 2017-02-01 HISTORY — DX: Benign prostatic hyperplasia without lower urinary tract symptoms: N40.0

## 2017-02-01 HISTORY — DX: Calculus of kidney: N20.0

## 2017-02-01 LAB — CBC WITH DIFFERENTIAL/PLATELET
BASOS ABS: 0.1 10*3/uL (ref 0–0.1)
Basophils Relative: 1 %
EOS PCT: 2 %
Eosinophils Absolute: 0.2 10*3/uL (ref 0–0.7)
HEMATOCRIT: 43.3 % (ref 40.0–52.0)
HEMOGLOBIN: 14.5 g/dL (ref 13.0–18.0)
LYMPHS ABS: 1 10*3/uL (ref 1.0–3.6)
Lymphocytes Relative: 10 %
MCH: 30.9 pg (ref 26.0–34.0)
MCHC: 33.5 g/dL (ref 32.0–36.0)
MCV: 92.1 fL (ref 80.0–100.0)
Monocytes Absolute: 0.8 10*3/uL (ref 0.2–1.0)
Monocytes Relative: 8 %
NEUTROS ABS: 7.8 10*3/uL — AB (ref 1.4–6.5)
NEUTROS PCT: 79 %
PLATELETS: 199 10*3/uL (ref 150–440)
RBC: 4.7 MIL/uL (ref 4.40–5.90)
RDW: 13.4 % (ref 11.5–14.5)
WBC: 9.9 10*3/uL (ref 3.8–10.6)

## 2017-02-01 LAB — COMPREHENSIVE METABOLIC PANEL
ALBUMIN: 3.6 g/dL (ref 3.5–5.0)
ALT: 12 U/L — ABNORMAL LOW (ref 17–63)
ANION GAP: 10 (ref 5–15)
AST: 19 U/L (ref 15–41)
Alkaline Phosphatase: 47 U/L (ref 38–126)
BUN: 21 mg/dL — AB (ref 6–20)
CALCIUM: 8.5 mg/dL — AB (ref 8.9–10.3)
CO2: 21 mmol/L — AB (ref 22–32)
Chloride: 105 mmol/L (ref 101–111)
Creatinine, Ser: 1.29 mg/dL — ABNORMAL HIGH (ref 0.61–1.24)
GFR calc Af Amer: >60 mL/min (ref >60)
GFR calc non Af Amer: 52 mL/min — ABNORMAL LOW (ref >60)
GLUCOSE: 97 mg/dL (ref 65–99)
POTASSIUM: 4 mmol/L (ref 3.5–5.1)
SODIUM: 136 mmol/L (ref 135–145)
Total Bilirubin: 0.8 mg/dL (ref 0.3–1.2)
Total Protein: 6.8 g/dL (ref 6.5–8.1)

## 2017-02-01 LAB — TROPONIN I

## 2017-02-01 LAB — TYPE AND SCREEN
ABO/RH(D): B POS
Antibody Screen: NEGATIVE

## 2017-02-01 LAB — APTT: aPTT: 28 seconds (ref 24–36)

## 2017-02-01 LAB — PROTIME-INR
INR: 1.14
Prothrombin Time: 14.5 seconds (ref 11.4–15.2)

## 2017-02-01 LAB — POCT I-STAT CREATININE: Creatinine, Ser: 1.5 mg/dL — ABNORMAL HIGH (ref 0.61–1.24)

## 2017-02-01 MED ORDER — IOPAMIDOL (ISOVUE-300) INJECTION 61%
125.0000 mL | Freq: Once | INTRAVENOUS | Status: AC | PRN
  Administered 2017-02-01: 100 mL via INTRAVENOUS

## 2017-02-01 MED ORDER — ONDANSETRON HCL 4 MG/2ML IJ SOLN: 4.0000 mg | Freq: Four times a day (QID) | INTRAMUSCULAR | Status: DC | PRN

## 2017-02-01 MED ORDER — NITROGLYCERIN 0.4 MG SL SUBL: 0.4000 mg | SUBLINGUAL_TABLET | SUBLINGUAL | Status: DC | PRN

## 2017-02-01 MED ORDER — FINASTERIDE 5 MG PO TABS
5.0000 mg | ORAL_TABLET | Freq: Every day | ORAL | Status: DC
  Administered 2017-02-02 – 2017-02-03 (×2): 5 mg via ORAL
  Filled 2017-02-01 (×2): qty 1

## 2017-02-01 MED ORDER — LEVOTHYROXINE SODIUM 112 MCG PO TABS
112.0000 ug | ORAL_TABLET | Freq: Every day | ORAL | Status: DC
  Administered 2017-02-02 – 2017-02-03 (×2): 112 ug via ORAL
  Filled 2017-02-01 (×2): qty 1

## 2017-02-01 MED ORDER — QUETIAPINE FUMARATE 25 MG PO TABS
50.0000 mg | ORAL_TABLET | Freq: Every day | ORAL | Status: DC
  Administered 2017-02-01 – 2017-02-02 (×2): 50 mg via ORAL
  Filled 2017-02-01 (×2): qty 2

## 2017-02-01 MED ORDER — TICAGRELOR 90 MG PO TABS
90.0000 mg | ORAL_TABLET | Freq: Two times a day (BID) | ORAL | Status: DC
  Administered 2017-02-01 – 2017-02-03 (×4): 90 mg via ORAL
  Filled 2017-02-01 (×4): qty 1

## 2017-02-01 MED ORDER — PANTOPRAZOLE SODIUM 40 MG PO TBEC
40.0000 mg | DELAYED_RELEASE_TABLET | Freq: Every day | ORAL | Status: DC
  Administered 2017-02-02 – 2017-02-03 (×2): 40 mg via ORAL
  Filled 2017-02-01 (×2): qty 1

## 2017-02-01 MED ORDER — ACETAMINOPHEN 325 MG PO TABS: 650.0000 mg | ORAL_TABLET | Freq: Four times a day (QID) | ORAL | Status: DC | PRN

## 2017-02-01 MED ORDER — CLONAZEPAM 0.5 MG PO TABS
0.5000 mg | ORAL_TABLET | Freq: Every day | ORAL | Status: DC
  Administered 2017-02-01 – 2017-02-02 (×2): 0.5 mg via ORAL
  Filled 2017-02-01 (×2): qty 1

## 2017-02-01 MED ORDER — HEPARIN (PORCINE) IN NACL 100-0.45 UNIT/ML-% IJ SOLN
1200.0000 [IU]/h | INTRAMUSCULAR | Status: DC
  Administered 2017-02-01: 1500 [IU]/h via INTRAVENOUS
  Filled 2017-02-01 (×2): qty 250

## 2017-02-01 MED ORDER — HEPARIN BOLUS VIA INFUSION
5000.0000 [IU] | Freq: Once | INTRAVENOUS | Status: AC
  Administered 2017-02-01: 5000 [IU] via INTRAVENOUS
  Filled 2017-02-01: qty 5000

## 2017-02-01 MED ORDER — ONDANSETRON HCL 4 MG PO TABS: 4.0000 mg | ORAL_TABLET | Freq: Four times a day (QID) | ORAL | Status: DC | PRN

## 2017-02-01 MED ORDER — ACETAMINOPHEN 650 MG RE SUPP: 650.0000 mg | Freq: Four times a day (QID) | RECTAL | Status: DC | PRN

## 2017-02-01 MED ORDER — ATORVASTATIN CALCIUM 20 MG PO TABS
40.0000 mg | ORAL_TABLET | Freq: Every day | ORAL | Status: DC
  Administered 2017-02-01 – 2017-02-02 (×2): 40 mg via ORAL
  Filled 2017-02-01 (×2): qty 2

## 2017-02-01 NOTE — Progress Notes (Signed)
Patient arrived to 2A Room 255. Patient denies pain and all questions answered. Patient oriented to unit and use of call bell/room phone. Skin assessment completed with Suzzanne Cloud and skin intact. A&Ox4 and VSS. Nursing staff will continue to monitor for any changes in patient status. Earleen Reaper, RN

## 2017-02-01 NOTE — H&P (Signed)
Christopher Carlson at [place] NAME: Christopher Carlson    MR#:  [id number]  DATE OF BIRTH:  [birth year]  DATE OF ADMISSION:  02/01/2017  PRIMARY CARE PHYSICIAN: Maryland Pink, MD   REQUESTING/REFERRING PHYSICIAN: Dr. Charlotte Crumb  CHIEF COMPLAINT:   Chief Complaint  Patient presents with  . Pulmonary Embolism    HISTORY OF PRESENT ILLNESS:  Christopher Carlson  is a 77 y.o. male with a known history of coronary artery disease status post stent placement in September of this past year, BPH, history of nephrolithiasis, hypothyroidism, hyperlipidemia who presents to the hospital as he was found to have an abnormal CT abdomen pelvis suggestive of acute pulmonary embolism. Patient has been having intermittent hematuria and was seen by his urologist who referred him to get a CT of his abdomen pelvis to look for nephrolithiasis. Incidentally on the CT patient was noted to have bilateral pulmonary emboli. Patient does state that he has been having worsening exertional shortness of breath now for the past 2 weeks. He denies any chest pain, hemoptysis, cough, congestion. He denies any pain in his lower calves or cramping in his lower extremities. He has not had any recent hip or knee surgery and does not have a sedentary lifestyle. Hospitalist services were contacted further treatment and evaluation.  PAST MEDICAL HISTORY:   Past Medical History:  Diagnosis Date  . BPH (benign prostatic hyperplasia)   . CAD (coronary artery disease)   . High cholesterol   . Nephrolithiasis   . Thyroid disease     PAST SURGICAL HISTORY:   Past Surgical History:  Procedure Laterality Date  . CORONARY STENT INTERVENTION N/A 10/12/2016   Procedure: CORONARY STENT INTERVENTION;  Surgeon: Yolonda Kida, MD;  Location: [facility];  Service: Cardiovascular;  Laterality: N/A;  . HERNIA REPAIR    . LEFT HEART CATH AND CORONARY ANGIOGRAPHY N/A 10/12/2016   Procedure: LEFT HEART  CATH AND CORONARY ANGIOGRAPHY and possible pci;  Surgeon: Yolonda Kida, MD;  Location: [facility];  Service: Cardiovascular;  Laterality: N/A;    SOCIAL HISTORY:   Social History   Tobacco Use  . Smoking status: Former Smoker    Packs/day: 2.50    Years: 20.00    Pack years: 50.00    Types: Cigarettes  . Smokeless tobacco: Never Used  Substance Use Topics  . Alcohol use: No    FAMILY HISTORY:   Family History  Problem Relation Age of Onset  . Cancer Father   . Alcohol abuse Father   . Prostate cancer Neg Hx   . Bladder Cancer Neg Hx   . Kidney cancer Neg Hx     DRUG ALLERGIES:   Allergies  Allergen Reactions  . Ciprofloxacin Rash    REVIEW OF SYSTEMS:   Review of Systems  Constitutional: Negative for fever and weight loss.  HENT: Negative for congestion, nosebleeds and tinnitus.   Eyes: Negative for blurred vision, double vision and redness.  Respiratory: Positive for shortness of breath. Negative for cough and hemoptysis.   Cardiovascular: Negative for chest pain, orthopnea, leg swelling and PND.  Gastrointestinal: Negative for abdominal pain, diarrhea, melena, nausea and vomiting.  Genitourinary: Positive for hematuria. Negative for dysuria and urgency.  Musculoskeletal: Negative for falls and joint pain.  Neurological: Negative for dizziness, tingling, sensory change, focal weakness, seizures, weakness and headaches.  Endo/Heme/Allergies: Negative for polydipsia. Does not bruise/bleed easily.  Psychiatric/Behavioral: Negative for depression and memory loss. The  patient is not nervous/anxious.     MEDICATIONS AT HOME:   Prior to Admission medications   Medication Sig Start Date End Date Taking? Authorizing Provider  aspirin EC 81 MG EC tablet Take 1 tablet (81 mg total) by mouth daily. 10/14/16  Yes Mody, Ulice Bold, MD  atorvastatin (LIPITOR) 40 MG tablet Take 40 mg by mouth daily. 07/03/16  Yes [provider]  clonazePAM (KLONOPIN) 1 MG  tablet Take 0.5 mg by mouth at bedtime.  09/10/16  Yes [provider]  finasteride (PROSCAR) 5 MG tablet Take 5 mg by mouth daily.   Yes [provider]  levothyroxine (SYNTHROID, LEVOTHROID) 112 MCG tablet Take 112 mcg by mouth daily. 07/03/16  Yes [provider]  nitroGLYCERIN (NITROSTAT) 0.4 MG SL tablet Place 1 tablet (0.4 mg total) under the tongue every 5 (five) minutes as needed for chest pain. 10/13/16  Yes Mody, Ulice Bold, MD  pantoprazole (PROTONIX) 40 MG tablet Take 40 mg by mouth daily. 07/03/16  Yes [provider]  QUEtiapine (SEROQUEL) 50 MG tablet Take 50 mg by mouth at bedtime. 07/03/16  Yes [provider]  ticagrelor (BRILINTA) 90 MG TABS tablet Take 1 tablet (90 mg total) by mouth 2 (two) times daily. 10/13/16  Yes Mody, Ulice Bold, MD      VITAL SIGNS:  Blood pressure 134/74, pulse 74, temperature 98.2 F (36.8 C), temperature source Oral, resp. rate 16, height 6' (1.829 m), weight 83.9 kg (185 lb), SpO2 100 %.  PHYSICAL EXAMINATION:  Physical Exam  GENERAL:  77 y.o.-year-old patient lying in the bed in no acute distress.  EYES: Pupils equal, round, reactive to light and accommodation. No scleral icterus. Extraocular muscles intact.  HEENT: Head atraumatic, normocephalic. Oropharynx and nasopharynx clear. No oropharyngeal erythema, moist oral mucosa  NECK:  Supple, no jugular venous distention. No thyroid enlargement, no tenderness.  LUNGS: Normal breath sounds bilaterally, no wheezing, rales, rhonchi. No use of accessory muscles of respiration.  CARDIOVASCULAR: S1, S2 RRR. No murmurs, rubs, gallops, clicks.  ABDOMEN: Soft, nontender, nondistended. Bowel sounds present. No organomegaly or mass.  EXTREMITIES: +1-2 pedal edema b/l, No cyanosis, or clubbing. + 2 pedal & radial pulses b/l.   NEUROLOGIC: Cranial nerves II through XII are intact. No focal Motor or sensory deficits appreciated b/l.  PSYCHIATRIC: The patient is alert and oriented x  3.  SKIN: No obvious rash, lesion, or ulcer.   LABORATORY PANEL:   CBC Recent Labs  Lab 02/01/17 1740  WBC 9.9  HGB 14.5  HCT 43.3  PLT 199   ------------------------------------------------------------------------------------------------------------------  Chemistries  Recent Labs  Lab 02/01/17 1740  NA 136  K 4.0  CL 105  CO2 21*  GLUCOSE 97  BUN 21*  CREATININE 1.29*  CALCIUM 8.5*  AST 19  ALT 12*  ALKPHOS 47  BILITOT 0.8   ------------------------------------------------------------------------------------------------------------------  Cardiac Enzymes Recent Labs  Lab 02/01/17 1740  TROPONINI <0.03   ------------------------------------------------------------------------------------------------------------------  RADIOLOGY:  Ct Hematuria Workup  Result Date: 02/01/2017 CLINICAL DATA:  Gross hematuria. History of renal calculi. Soreness. Recent urinary tract infection. EXAM: CT ABDOMEN AND PELVIS WITHOUT AND WITH CONTRAST TECHNIQUE: Multidetector CT imaging of the abdomen and pelvis was performed following the standard protocol before and following the bolus administration of intravenous contrast. CONTRAST:  114mL ISOVUE-300 IOPAMIDOL (ISOVUE-300) INJECTION 61% COMPARISON:  None. FINDINGS: Lower chest: 3 by 5 mm and adjacent 3 by 3 mm left lower lobe pulmonary nodules, image 8/6. 3 mm right middle lobe pulmonary nodule on image  5/17. 3 mm subpleural nodule in the left lower lobe on image 26/17. Circumflex coronary artery atherosclerotic calcification. There is abnormal filling defect in both lower lobe pulmonary arteries compatible with pulmonary embolus, visible on the portal venous and the delayed phase images. Hepatobiliary: Mildly contracted gallbladder. Otherwise unremarkable. Pancreas: Unremarkable Spleen: Remarkable Adrenals/Urinary Tract: Nonobstructive 4 mm right kidney lower pole calculus. Nonobstructive 2 mm left kidney lower pole calculus. No ureteral or  bladder calculus. No hydronephrosis or hydroureter. No significant appreciable filling defects along the urothelium. A 8 mm left kidney lower pole hypodense lesion on image 65/10 is statistically likely to be a small cyst but technically nonspecific. Similar nonspecific 4 mm hypodense lesion in the left kidney upper pole. Stomach/Bowel: Small type 1 hiatal hernia. Descending and sigmoid colon diverticulosis without active diverticulitis. Appendix normal. Vascular/Lymphatic: Aortoiliac atherosclerotic vascular disease. No obvious pelvic DVT. No pathologic adenopathy in the abdomen or pelvis. Reproductive: Prominently enlarged prostate gland, measuring 6.8 by 5.9 by 7.6 cm (volume = 160 cm^3). Dense calcification in the apical peripheral zone, right greater than left. Other: No supplemental non-categorized findings. Musculoskeletal: Lumbar spondylosis and degenerative disc disease causing mild impingement at L4-5 and L5-S 1. Grade 1 degenerative anterolisthesis at L4-5 with vacuum disc phenomenon. Small direct right inguinal hernia contains adipose tissue. IMPRESSION: 1. Filling defect in both lower lobe pulmonary arteries compatible with pulmonary embolus. Not entirely age determinate but this could well be acute pulmonary embolus based on central positioning of some of the filling defect. Right ventricular to left ventricular ratio 0.91, consistent with at least submassive (intermediate risk) PE. The presence of right heart strain has been associated with an increased risk of morbidity and mortality. I discussed this with Dr. Pilar Jarvis and he plans to send the patient immediately to the emergency department. This patient qualifies for activation of Code PE by paging [phone]. 2. Bilateral nonobstructive nephrolithiasis. 3. Markedly enlarged prostate gland, volume estimated at 1:00 60 cubic cm. Dense calcification in the apical peripheral zone, right greater than left. 4. Small bibasilar pulmonary nodules in the 3-4  mm range. No follow-up needed if patient is low-risk (and has no known or suspected primary neoplasm). Non-contrast chest CT can be considered in 12 months if patient is high-risk. This recommendation follows the consensus statement: Guidelines for Management of Incidental Pulmonary Nodules Detected on CT Images: From the Fleischner Society 2017; Radiology 2017; 284:228-243. 5.  Aortic Atherosclerosis (ICD10-I70.0).  Coronary atherosclerosis. 6. Other imaging findings of potential clinical significance: Small type 1 hiatal hernia. Descending and sigmoid colon diverticulosis. Lumbar spondylosis and degenerative disc disease causing mild impingement at L4-5 and L5-S1. Small direct right inguinal hernia containing adipose tissue. Critical Value/emergent results were called by telephone at the time of interpretation on 02/01/2017 at 4:06 pm to Dr. Baruch Gouty , who verbally acknowledged these results. Electronically Signed   By: Van Clines M.D.   On: 02/01/2017 16:15     IMPRESSION AND PLAN:   77 year old male with past medical history of coronary artery disease status post stent placement, essential hypertension, previous history of nephrolithiasis, BPH, anxiety, hyperlipidemia who presents to the hospital due to intermittent hematuria and had an outpatient CT abdomen to look for nephrolithiasis and incidentally noted to have acute pulmonary embolism.  1. Acute pulmonary embolism-this was incidentally noted on the CT abdomen pelvis looking for nephrolithiasis. Patient does state that he's been having exertional shortness of breath now for the past 2 weeks. -We'll treat the patient with IV heparin drip for now.  We'll check Dopplers of lower extremities to rule out DVT. -This is likely unprovoked DVT and would consider doing a thrombophilia workup. Patient has no risk factors like hip or knee surgery, or a sedentary lifestyle or any history of malignancy.  2. Hematuria-intermittent in nature related to  underlying nephrolithiasis which is nonobstructive. -Hemoglobin stable and currently not having hematuria. Continue follow-up with urology as an outpatient. -Continue finasteride.  3. History of coronary artery disease status post stent placement-currently not having any chest pain. -Patient's stent was in September of this past year. I will continue his Brillinta and Hold ASA for now.  - cont. Statin.   4. Hypothyroidism-continue Synthroid.  5. Hyperlipidemia-continue atorvastatin.  6. GERD-continue Protonix.  All the records are reviewed and case discussed with ED provider. Management plans discussed with the patient, family and they are in agreement.  CODE STATUS: Full code  TOTAL TIME TAKING CARE OF THIS PATIENT: 45 minutes.    Henreitta Leber M.D on 02/01/2017 at 7:17 PM  Between 7am to 6pm - Pager - [phone]  After 6pm go to [email or web address] - password [email or web address]  [facility]  Office  [phone]  CC: Primary care physician; Maryland Pink, MD

## 2017-02-01 NOTE — Telephone Encounter (Signed)
Spoke with Maudie Mercury, [facility] charge nurse, and made aware pt was arriving to ER via POV. Kim made aware of why pt was being sent to ER. Kim voiced understanding.

## 2017-02-01 NOTE — Progress Notes (Signed)
ANTICOAGULATION CONSULT NOTE - Initial Consult  Pharmacy Consult for Heparin  Indication: pulmonary embolus  Allergies  Allergen Reactions  . Ciprofloxacin Rash    Patient Measurements: Height: 6' (182.9 cm) Weight: 185 lb (83.9 kg) IBW/kg (Calculated) : 77.6 Heparin Dosing Weight: 83.9 kg   Vital Signs: Temp: 98.2 F (36.8 C) (01/04 1722) Temp Source: Oral (01/04 1722) BP: 133/77 (01/04 1729) Pulse Rate: 60 (01/04 1729)  Labs: Recent Labs    02/01/17 1516 02/01/17 1740  HGB  --  14.5  HCT  --  43.3  PLT  --  199  CREATININE 1.50*  --     Estimated Creatinine Clearance: 46 mL/min (A) (by C-G formula based on SCr of 1.5 mg/dL (H)).   Medical History: Past Medical History:  Diagnosis Date  . High cholesterol   . Thyroid disease     Medications:   (Not in a hospital admission)  Assessment: Pharmacy consulted to dose heparin in this 77 year old male admitted with PE.  No prior anticoag noted.   CrCl = 46 ml/min   Goal of Therapy:  Heparin level 0.3-0.7 units/ml Monitor platelets by anticoagulation protocol: Yes   Plan:  Give 5000 units bolus x 1 Start heparin infusion at 1500 units/hr Check anti-Xa level in 8 hours and daily while on heparin Continue to monitor H&H and platelets  Pleshette Tomasini D 02/01/2017,6:04 PM

## 2017-02-01 NOTE — ED Triage Notes (Addendum)
PT arrived via POV after CT scan this afternoon was positive for PE. Pt states he is on Brillinta currently, states he had cardiac cath in Sept with stent placement.   Pt denies any chest pain or shortness of breath at this time, Pt is ambulatory without assistance. Pt able to speak in completed sentences without running out of breath.  CT scan completed after blood found in urine today and had kidney stones.

## 2017-02-01 NOTE — ED Triage Notes (Addendum)
FIRST NURSE NOTE-here for known PE from CT scan today. No respiratory distress noted. Unlabored. Ambulatory

## 2017-02-01 NOTE — Telephone Encounter (Signed)
Per Dr. Pilar Jarvis a PE has been noted on pt CT scan. Dr. Pilar Jarvis ordered for pt to go to the ER STAT. Spoke with pt and made aware. Pt voiced understanding.  Will call to make ER aware.

## 2017-02-01 NOTE — ED Provider Notes (Signed)
[facility] Emergency Department Provider Note  ____________________________________________   I have reviewed the triage vital signs and the nursing notes. Where available I have reviewed prior notes and, if possible and indicated, outside hospital notes.    HISTORY  Chief Complaint Pulmonary Embolism    HPI Christopher Carlson is a 77 y.o. male who presents today complaining "of they told me I had a PE" has had some chronic hematuria apparently was sent by urology for CT scan to rule out mass or other contributing pathology and was found to have bilateral significant pulmonary emboli with what appears to be right heart strain.  Patient is not having any chest pain or shortness of breath at this time but he does have exertional chest pain since his heart stent was placed, but it became acutely worse a week and a half ago.  Denies any significant leg swelling, or any chest pain at any time.  He denies fever or chills.  He states he is compliant with his medications.   He has no history of easy bleeding or bruising aside from hematuria, no history of GI bleed no history of head bleed.   Past Medical History:  Diagnosis Date  . High cholesterol   . Thyroid disease     Patient Active Problem List   Diagnosis Date Noted  . Syncope 10/10/2016    Past Surgical History:  Procedure Laterality Date  . CORONARY STENT INTERVENTION N/A 10/12/2016   Procedure: CORONARY STENT INTERVENTION;  Surgeon: Yolonda Kida, MD;  Location: [facility];  Service: Cardiovascular;  Laterality: N/A;  . HERNIA REPAIR    . LEFT HEART CATH AND CORONARY ANGIOGRAPHY N/A 10/12/2016   Procedure: LEFT HEART CATH AND CORONARY ANGIOGRAPHY and possible pci;  Surgeon: Yolonda Kida, MD;  Location: [facility];  Service: Cardiovascular;  Laterality: N/A;    Prior to Admission medications   Medication Sig Start Date End Date Taking? Authorizing Provider  aspirin EC 81 MG  EC tablet Take 1 tablet (81 mg total) by mouth daily. 10/14/16  Yes Mody, Ulice Bold, MD  atorvastatin (LIPITOR) 40 MG tablet Take 40 mg by mouth daily. 07/03/16  Yes [provider]  clonazePAM (KLONOPIN) 1 MG tablet Take 0.5 mg by mouth at bedtime.  09/10/16  Yes [provider]  finasteride (PROSCAR) 5 MG tablet Take 5 mg by mouth daily.   Yes [provider]  levothyroxine (SYNTHROID, LEVOTHROID) 112 MCG tablet Take 112 mcg by mouth daily. 07/03/16  Yes [provider]  nitroGLYCERIN (NITROSTAT) 0.4 MG SL tablet Place 1 tablet (0.4 mg total) under the tongue every 5 (five) minutes as needed for chest pain. 10/13/16  Yes Mody, Ulice Bold, MD  pantoprazole (PROTONIX) 40 MG tablet Take 40 mg by mouth daily. 07/03/16  Yes [provider]  QUEtiapine (SEROQUEL) 50 MG tablet Take 50 mg by mouth at bedtime. 07/03/16  Yes [provider]  ticagrelor (BRILINTA) 90 MG TABS tablet Take 1 tablet (90 mg total) by mouth 2 (two) times daily. 10/13/16  Yes Bettey Costa, MD    Allergies Ciprofloxacin  Family History  Problem Relation Age of Onset  . Prostate cancer Neg Hx   . Bladder Cancer Neg Hx   . Kidney cancer Neg Hx     Social History Social History   Tobacco Use  . Smoking status: Never Smoker  . Smokeless tobacco: Never Used  Substance Use Topics  . Alcohol use: No  . Drug use:  No    Review of Systems Constitutional: No fever/chills Eyes: No visual changes. ENT: No sore throat. No stiff neck no neck pain Cardiovascular: Denies chest pain. Respiratory: +shortness of breath. Gastrointestinal:   no vomiting.  No diarrhea.  No constipation. Genitourinary: Negative for dysuria. Musculoskeletal: Negative lower extremity swelling Skin: Negative for rash. Neurological: Negative for severe headaches, focal weakness or numbness.   ____________________________________________   PHYSICAL EXAM:  VITAL SIGNS: ED Triage Vitals  Enc Vitals Group     BP  02/01/17 1729 133/77     Pulse Rate 02/01/17 1729 60     Resp 02/01/17 1729 15     Temp 02/01/17 1722 98.2 F (36.8 C)     Temp Source 02/01/17 1722 Oral     SpO2 02/01/17 1729 97 %     Weight 02/01/17 1720 185 lb (83.9 kg)     Height 02/01/17 1720 6' (1.829 m)     Head Circumference --      Peak Flow --      Pain Score 02/01/17 1720 0     Pain Loc --      Pain Edu? --      Excl. in [place]? --     Constitutional: Alert and oriented. Well appearing and in no acute distress. Eyes: Conjunctivae are normal Head: Atraumatic HEENT: No congestion/rhinnorhea. Mucous membranes are moist.  Oropharynx non-erythematous Neck:   Nontender with no meningismus, no masses, no stridor Cardiovascular: Normal rate, regular rhythm. Grossly normal heart sounds.  Good peripheral circulation. Respiratory: Normal respiratory effort.  No retractions. Lungs CTAB. Abdominal: Soft and nontender. No distention. No guarding no rebound Back:  There is no focal tenderness or step off.  there is no midline tenderness there are no lesions noted. there is no CVA tenderness Musculoskeletal: No lower extremity tenderness, no upper extremity tenderness. No joint effusions, no DVT signs strong distal pulses no edema Neurologic:  Normal speech and language. No gross focal neurologic deficits are appreciated.  Skin:  Skin is warm, dry and intact. No rash noted. Psychiatric: Mood and affect are normal. Speech and behavior are normal.  ____________________________________________   LABS (all labs ordered are listed, but only abnormal results are displayed)  Labs Reviewed  CBC WITH DIFFERENTIAL/PLATELET - Abnormal; Notable for the following components:      Result Value   Neutro Abs 7.8 (*)    All other components within normal limits  COMPREHENSIVE METABOLIC PANEL - Abnormal; Notable for the following components:   CO2 21 (*)    BUN 21 (*)    Creatinine, Ser 1.29 (*)    Calcium 8.5 (*)    ALT 12 (*)    GFR calc non  Af Amer 52 (*)    All other components within normal limits  PROTIME-INR  TROPONIN I  APTT  HEPARIN LEVEL (UNFRACTIONATED)  TYPE AND SCREEN    Pertinent labs  results that were available during my care of the patient were reviewed by me and considered in my medical decision making (see chart for details). ____________________________________________  EKG  I personally interpreted any EKGs ordered by me or triage Sinus rhythm PACs noted right bundle branch block, no acute ST elevation or depression, rate 71 ____________________________________________  RADIOLOGY  Pertinent labs & imaging results that were available during my care of the patient were reviewed by me and considered in my medical decision making (see chart for details). If possible, patient and/or family made aware of any abnormal findings.  Ct Hematuria Workup  Result Date: 02/01/2017 CLINICAL DATA:  Gross hematuria. History of renal calculi. Soreness. Recent urinary tract infection. EXAM: CT ABDOMEN AND PELVIS WITHOUT AND WITH CONTRAST TECHNIQUE: Multidetector CT imaging of the abdomen and pelvis was performed following the standard protocol before and following the bolus administration of intravenous contrast. CONTRAST:  192mL ISOVUE-300 IOPAMIDOL (ISOVUE-300) INJECTION 61% COMPARISON:  None. FINDINGS: Lower chest: 3 by 5 mm and adjacent 3 by 3 mm left lower lobe pulmonary nodules, image 8/6. 3 mm right middle lobe pulmonary nodule on image 5/17. 3 mm subpleural nodule in the left lower lobe on image 26/17. Circumflex coronary artery atherosclerotic calcification. There is abnormal filling defect in both lower lobe pulmonary arteries compatible with pulmonary embolus, visible on the portal venous and the delayed phase images. Hepatobiliary: Mildly contracted gallbladder. Otherwise unremarkable. Pancreas: Unremarkable Spleen: Remarkable Adrenals/Urinary Tract: Nonobstructive 4 mm right kidney lower pole calculus. Nonobstructive 2  mm left kidney lower pole calculus. No ureteral or bladder calculus. No hydronephrosis or hydroureter. No significant appreciable filling defects along the urothelium. A 8 mm left kidney lower pole hypodense lesion on image 65/10 is statistically likely to be a small cyst but technically nonspecific. Similar nonspecific 4 mm hypodense lesion in the left kidney upper pole. Stomach/Bowel: Small type 1 hiatal hernia. Descending and sigmoid colon diverticulosis without active diverticulitis. Appendix normal. Vascular/Lymphatic: Aortoiliac atherosclerotic vascular disease. No obvious pelvic DVT. No pathologic adenopathy in the abdomen or pelvis. Reproductive: Prominently enlarged prostate gland, measuring 6.8 by 5.9 by 7.6 cm (volume = 160 cm^3). Dense calcification in the apical peripheral zone, right greater than left. Other: No supplemental non-categorized findings. Musculoskeletal: Lumbar spondylosis and degenerative disc disease causing mild impingement at L4-5 and L5-S 1. Grade 1 degenerative anterolisthesis at L4-5 with vacuum disc phenomenon. Small direct right inguinal hernia contains adipose tissue. IMPRESSION: 1. Filling defect in both lower lobe pulmonary arteries compatible with pulmonary embolus. Not entirely age determinate but this could well be acute pulmonary embolus based on central positioning of some of the filling defect. Right ventricular to left ventricular ratio 0.91, consistent with at least submassive (intermediate risk) PE. The presence of right heart strain has been associated with an increased risk of morbidity and mortality. I discussed this with Dr. Pilar Jarvis and he plans to send the patient immediately to the emergency department. This patient qualifies for activation of Code PE by paging [phone]. 2. Bilateral nonobstructive nephrolithiasis. 3. Markedly enlarged prostate gland, volume estimated at 1:00 60 cubic cm. Dense calcification in the apical peripheral zone, right greater than  left. 4. Small bibasilar pulmonary nodules in the 3-4 mm range. No follow-up needed if patient is low-risk (and has no known or suspected primary neoplasm). Non-contrast chest CT can be considered in 12 months if patient is high-risk. This recommendation follows the consensus statement: Guidelines for Management of Incidental Pulmonary Nodules Detected on CT Images: From the Fleischner Society 2017; Radiology 2017; 284:228-243. 5.  Aortic Atherosclerosis (ICD10-I70.0).  Coronary atherosclerosis. 6. Other imaging findings of potential clinical significance: Small type 1 hiatal hernia. Descending and sigmoid colon diverticulosis. Lumbar spondylosis and degenerative disc disease causing mild impingement at L4-5 and L5-S1. Small direct right inguinal hernia containing adipose tissue. Critical Value/emergent results were called by telephone at the time of interpretation on 02/01/2017 at 4:06 pm to Dr. Baruch Gouty , who verbally acknowledged these results. Electronically Signed   By: Van Clines M.D.   On: 02/01/2017 16:15   ____________________________________________    PROCEDURES  Procedure(s) performed: None  Procedures  Critical Care performed: CRITICAL CARE Performed by: Schuyler Amor   Total critical care time: 45 minutes  Critical care time was exclusive of separately billable procedures and treating other patients.  Critical care was necessary to treat or prevent imminent or life-threatening deterioration.  Critical care was time spent personally by me on the following activities: development of treatment plan with patient and/or surrogate as well as nursing, discussions with consultants, evaluation of patient's response to treatment, examination of patient, obtaining history from patient or surrogate, ordering and performing treatments and interventions, ordering and review of laboratory studies, ordering and review of radiographic studies, pulse oximetry and re-evaluation of  patient's condition.   ____________________________________________   INITIAL IMPRESSION / ASSESSMENT AND PLAN / ED COURSE  Pertinent labs & imaging results that were available during my care of the patient were reviewed by me and considered in my medical decision making (see chart for details).  Here with significant pulmonary emboli, discussed with radiologist they feel there is no question that these are real, I do not think therefore a CT scan of his chest with his borderline creatinine is indicated.  I discussed with pharmacy, they agree that Brilinta is just not sufficient coverage and we have started patient on heparin.  No evidence of the patient is at risk for significant decomposition from heparin no history of head bleed or rectal bleed for example, we will admit the patient to the hospital.  He is stable at this time    ____________________________________________   FINAL CLINICAL IMPRESSION(S) / ED DIAGNOSES  Final diagnoses:  Other acute pulmonary embolism with acute cor pulmonale ([place])      This chart was dictated using voice recognition software.  Despite best efforts to proofread,  errors can occur which can change meaning.      Schuyler Amor, MD 02/01/17 [phone]

## 2017-02-01 NOTE — ED Notes (Signed)
Attempted report; was told nurse would call me back.

## 2017-02-02 ENCOUNTER — Inpatient Hospital Stay: Payer: Medicare HMO

## 2017-02-02 LAB — BASIC METABOLIC PANEL
ANION GAP: 8 (ref 5–15)
BUN: 19 mg/dL (ref 6–20)
CHLORIDE: 108 mmol/L (ref 101–111)
CO2: 21 mmol/L — AB (ref 22–32)
Calcium: 8.5 mg/dL — ABNORMAL LOW (ref 8.9–10.3)
Creatinine, Ser: 1.16 mg/dL (ref 0.61–1.24)
GFR calc non Af Amer: 59 mL/min — ABNORMAL LOW (ref >60)
Glucose, Bld: 92 mg/dL (ref 65–99)
POTASSIUM: 3.7 mmol/L (ref 3.5–5.1)
SODIUM: 137 mmol/L (ref 135–145)

## 2017-02-02 LAB — CBC
HEMATOCRIT: 41.6 % (ref 40.0–52.0)
HEMOGLOBIN: 13.9 g/dL (ref 13.0–18.0)
MCH: 30.4 pg (ref 26.0–34.0)
MCHC: 33.3 g/dL (ref 32.0–36.0)
MCV: 91.2 fL (ref 80.0–100.0)
Platelets: 190 10*3/uL (ref 150–440)
RBC: 4.56 MIL/uL (ref 4.40–5.90)
RDW: 13.3 % (ref 11.5–14.5)
WBC: 8.1 10*3/uL (ref 3.8–10.6)

## 2017-02-02 LAB — URINALYSIS, COMPLETE (UACMP) WITH MICROSCOPIC
BACTERIA UA: NONE SEEN
BILIRUBIN URINE: NEGATIVE
GLUCOSE, UA: NEGATIVE mg/dL
KETONES UR: NEGATIVE mg/dL
LEUKOCYTES UA: NEGATIVE
NITRITE: NEGATIVE
PROTEIN: NEGATIVE mg/dL
Specific Gravity, Urine: 1.005 (ref 1.005–1.030)
Squamous Epithelial / LPF: NONE SEEN
pH: 7 (ref 5.0–8.0)

## 2017-02-02 LAB — HEPARIN LEVEL (UNFRACTIONATED)
HEPARIN UNFRACTIONATED: 0.8 [IU]/mL — AB (ref 0.30–0.70)
Heparin Unfractionated: 0.95 IU/mL — ABNORMAL HIGH (ref 0.30–0.70)

## 2017-02-02 MED ORDER — APIXABAN 5 MG PO TABS
10.0000 mg | ORAL_TABLET | Freq: Two times a day (BID) | ORAL | Status: DC
  Administered 2017-02-02 – 2017-02-03 (×3): 10 mg via ORAL
  Filled 2017-02-02 (×2): qty 2

## 2017-02-02 MED ORDER — APIXABAN 5 MG PO TABS: 5.0000 mg | ORAL_TABLET | Freq: Two times a day (BID) | ORAL | Status: DC

## 2017-02-02 NOTE — Progress Notes (Signed)
Patient ID: Kai Calico, male   DOB: 12-11-1940, 77 y.o.   MRN: [id number]  [facility] PROGRESS NOTE  Trevontae Lindahl ZTI:[id number] DOB: 1940-10-26 DOA: 02/01/2017 PCP: [place] Pink, MD  HPI/Subjective: Patient's been having shortness of breath going on for a while.  Ended up having a CT scan for his hematuria which found pulmonary embolisms  Objective: Vitals:   02/01/17 2033 02/02/17 0311  BP: (!) 147/62 110/60  Pulse: (!) 59 (!) 53  Resp: 18 18  Temp: 98.2 F (36.8 C) 97.6 F (36.4 C)  SpO2: 95% 96%    Filed Weights   02/01/17 1720  Weight: 83.9 kg (185 lb)    ROS: Review of Systems  Constitutional: Negative for chills and fever.  Eyes: Negative for blurred vision.  Respiratory: Positive for shortness of breath. Negative for cough.   Cardiovascular: Negative for chest pain.  Gastrointestinal: Negative for abdominal pain, constipation, diarrhea, nausea and vomiting.  Genitourinary: Positive for hematuria. Negative for dysuria.  Musculoskeletal: Negative for joint pain.  Neurological: Negative for dizziness and headaches.   Exam: Physical Exam  Constitutional: He is oriented to person, place, and time.  HENT:  Nose: No mucosal edema.  Mouth/Throat: No oropharyngeal exudate or posterior oropharyngeal edema.  Eyes: Conjunctivae, EOM and lids are normal. Pupils are equal, round, and reactive to light.  Neck: No JVD present. Carotid bruit is not present. No edema present. No thyroid mass and no thyromegaly present.  Cardiovascular: S1 normal and S2 normal. Exam reveals no gallop.  No murmur heard. Pulses:      Dorsalis pedis pulses are 2+ on the right side, and 2+ on the left side.  Respiratory: No respiratory distress. He has no wheezes. He has no rhonchi. He has no rales.  GI: Soft. Bowel sounds are normal. There is no tenderness.  Musculoskeletal:       Right ankle: He exhibits no swelling.       Left ankle: He exhibits no swelling.  Lymphadenopathy:     He has no cervical adenopathy.  Neurological: He is alert and oriented to person, place, and time. No cranial nerve deficit.  Skin: Skin is warm. No rash noted. Nails show no clubbing.  Psychiatric: He has a normal mood and affect.      Data Reviewed: Basic Metabolic Panel: Recent Labs  Lab 02/01/17 1516 02/01/17 1740 02/02/17 0146  NA  --  136 137  K  --  4.0 3.7  CL  --  105 108  CO2  --  21* 21*  GLUCOSE  --  97 92  BUN  --  21* 19  CREATININE 1.50* 1.29* 1.16  CALCIUM  --  8.5* 8.5*   Liver Function Tests: Recent Labs  Lab 02/01/17 1740  AST 19  ALT 12*  ALKPHOS 47  BILITOT 0.8  PROT 6.8  ALBUMIN 3.6   CBC: Recent Labs  Lab 02/01/17 1740 02/02/17 0146  WBC 9.9 8.1  NEUTROABS 7.8*  --   HGB 14.5 13.9  HCT 43.3 41.6  MCV 92.1 91.2  PLT 199 190   Cardiac Enzymes: Recent Labs  Lab 02/01/17 1740  TROPONINI <0.03    Studies: US Venous Img Lower Bilateral  Result Date: 02/02/2017 CLINICAL DATA:  Bilateral lower extremity edema. History of prior pulmonary embolism. Evaluate for DVT. EXAM: BILATERAL LOWER EXTREMITY VENOUS DOPPLER ULTRASOUND TECHNIQUE: Gray-scale sonography with graded compression, as well as color Doppler and duplex ultrasound were performed to evaluate the lower extremity deep venous systems from the  level of the common femoral vein and including the common femoral, femoral, profunda femoral, popliteal and calf veins including the posterior tibial, peroneal and gastrocnemius veins when visible. The superficial great saphenous vein was also interrogated. Spectral Doppler was utilized to evaluate flow at rest and with distal augmentation maneuvers in the common femoral, femoral and popliteal veins. COMPARISON:  None. FINDINGS: RIGHT LOWER EXTREMITY Common Femoral Vein: No evidence of thrombus. Normal compressibility, respiratory phasicity and response to augmentation. Saphenofemoral Junction: No evidence of thrombus. Normal compressibility and flow  on color Doppler imaging. Profunda Femoral Vein: No evidence of thrombus. Normal compressibility and flow on color Doppler imaging. Femoral Vein: No evidence of thrombus. Normal compressibility, respiratory phasicity and response to augmentation. Popliteal Vein: No evidence of thrombus. Normal compressibility, respiratory phasicity and response to augmentation. Calf Veins: No evidence of thrombus. Normal compressibility and flow on color Doppler imaging. Superficial Great Saphenous Vein: No evidence of thrombus. Normal compressibility. Venous Reflux:  None. Other Findings:  None. LEFT LOWER EXTREMITY Common Femoral Vein: No evidence of thrombus. Normal compressibility, respiratory phasicity and response to augmentation. Saphenofemoral Junction: No evidence of thrombus. Normal compressibility and flow on color Doppler imaging. Profunda Femoral Vein: No evidence of thrombus. Normal compressibility and flow on color Doppler imaging. Femoral Vein: No evidence of thrombus. Normal compressibility, respiratory phasicity and response to augmentation. Popliteal Vein: There is hypoechoic, expansile near occlusive DVT within the left popliteal vein (images 54 through 60). Calf Veins: No evidence of thrombus. Normal compressibility and flow on color Doppler imaging. Superficial Great Saphenous Vein: No evidence of thrombus. Normal compressibility. Venous Reflux:  None. Other Findings:  None. IMPRESSION: 1. Examination is positive for short segment near occlusive DVT involving the left popliteal vein. 2. No evidence of acute or chronic DVT within the right lower extremity. Electronically Signed   By: Sandi Mariscal M.D.   On: 02/02/2017 09:23   Ct Hematuria Workup  Result Date: 02/01/2017 CLINICAL DATA:  Gross hematuria. History of renal calculi. Soreness. Recent urinary tract infection. EXAM: CT ABDOMEN AND PELVIS WITHOUT AND WITH CONTRAST TECHNIQUE: Multidetector CT imaging of the abdomen and pelvis was performed following the  standard protocol before and following the bolus administration of intravenous contrast. CONTRAST:  11mL ISOVUE-300 IOPAMIDOL (ISOVUE-300) INJECTION 61% COMPARISON:  None. FINDINGS: Lower chest: 3 by 5 mm and adjacent 3 by 3 mm left lower lobe pulmonary nodules, image 8/6. 3 mm right middle lobe pulmonary nodule on image 5/17. 3 mm subpleural nodule in the left lower lobe on image 26/17. Circumflex coronary artery atherosclerotic calcification. There is abnormal filling defect in both lower lobe pulmonary arteries compatible with pulmonary embolus, visible on the portal venous and the delayed phase images. Hepatobiliary: Mildly contracted gallbladder. Otherwise unremarkable. Pancreas: Unremarkable Spleen: Remarkable Adrenals/Urinary Tract: Nonobstructive 4 mm right kidney lower pole calculus. Nonobstructive 2 mm left kidney lower pole calculus. No ureteral or bladder calculus. No hydronephrosis or hydroureter. No significant appreciable filling defects along the urothelium. A 8 mm left kidney lower pole hypodense lesion on image 65/10 is statistically likely to be a small cyst but technically nonspecific. Similar nonspecific 4 mm hypodense lesion in the left kidney upper pole. Stomach/Bowel: Small type 1 hiatal hernia. Descending and sigmoid colon diverticulosis without active diverticulitis. Appendix normal. Vascular/Lymphatic: Aortoiliac atherosclerotic vascular disease. No obvious pelvic DVT. No pathologic adenopathy in the abdomen or pelvis. Reproductive: Prominently enlarged prostate gland, measuring 6.8 by 5.9 by 7.6 cm (volume = 160 cm^3). Dense calcification in the apical peripheral zone,  right greater than left. Other: No supplemental non-categorized findings. Musculoskeletal: Lumbar spondylosis and degenerative disc disease causing mild impingement at L4-5 and L5-S 1. Grade 1 degenerative anterolisthesis at L4-5 with vacuum disc phenomenon. Small direct right inguinal hernia contains adipose tissue.  IMPRESSION: 1. Filling defect in both lower lobe pulmonary arteries compatible with pulmonary embolus. Not entirely age determinate but this could well be acute pulmonary embolus based on central positioning of some of the filling defect. Right ventricular to left ventricular ratio 0.91, consistent with at least submassive (intermediate risk) PE. The presence of right heart strain has been associated with an increased risk of morbidity and mortality. I discussed this with Dr. Pilar Jarvis and he plans to send the patient immediately to the emergency department. This patient qualifies for activation of Code PE by paging [phone]. 2. Bilateral nonobstructive nephrolithiasis. 3. Markedly enlarged prostate gland, volume estimated at 1:00 60 cubic cm. Dense calcification in the apical peripheral zone, right greater than left. 4. Small bibasilar pulmonary nodules in the 3-4 mm range. No follow-up needed if patient is low-risk (and has no known or suspected primary neoplasm). Non-contrast chest CT can be considered in 12 months if patient is high-risk. This recommendation follows the consensus statement: Guidelines for Management of Incidental Pulmonary Nodules Detected on CT Images: From the Fleischner Society 2017; Radiology 2017; 284:228-243. 5.  Aortic Atherosclerosis (ICD10-I70.0).  Coronary atherosclerosis. 6. Other imaging findings of potential clinical significance: Small type 1 hiatal hernia. Descending and sigmoid colon diverticulosis. Lumbar spondylosis and degenerative disc disease causing mild impingement at L4-5 and L5-S1. Small direct right inguinal hernia containing adipose tissue. Critical Value/emergent results were called by telephone at the time of interpretation on 02/01/2017 at 4:06 pm to Dr. Baruch Gouty , who verbally acknowledged these results. Electronically Signed   By: Van Clines M.D.   On: 02/01/2017 16:15    Scheduled Meds: . apixaban  10 mg Oral BID   Followed by  . [START ON  02/09/2017] apixaban  5 mg Oral BID  . atorvastatin  40 mg Oral Daily  . clonazePAM  0.5 mg Oral QHS  . finasteride  5 mg Oral Daily  . levothyroxine  112 mcg Oral QAC breakfast  . pantoprazole  40 mg Oral Daily  . QUEtiapine  50 mg Oral QHS  . ticagrelor  90 mg Oral BID   Continuous Infusions:  Assessment/Plan:  1. Pulmonary embolism and DVT.  Patient on heparin drip.  Switched over to [employer]. 2. Hematuria.  Case discussed with Dr. Erlene Quan urology.  She is suggested treat pulmonary embolism and they will deal with the hematuria as outpatient.  Check another hemoglobin tomorrow.  Make sure hemoglobin is pretty stable prior to disposition. 3. Hyperlipidemia unspecified on atorvastatin 4. BPH on finasteride 5. Hypothyroidism unspecified on levothyroxine  6. history of CAD on Brilinta.  Aspirin stopped since patient also on Eliquis now.  No beta-blocker with bradycardia.  On atorvastatin.  Code Status:     Code Status Orders  (From admission, onward)        Start     Ordered   02/01/17 2028  Full code  Continuous     02/01/17 2027    Code Status History    Date Active Date Inactive Code Status Order ID Comments User Context   10/10/2016 23:53 10/13/2016 18:28 Full Code [id number]  Harvie Bridge, DO Inpatient    Advance Directive Documentation     Most Recent Value  Type of Advance Directive  Healthcare Power  of Attorney, Living will  Pre-existing out of facility DNR order (yellow form or pink MOST form)  No data  "MOST" Form in Place?  No data     Family Communication: Daughter at the bedside Disposition Plan: Home tomorrow if hemoglobin stable  Time spent: 35 minutes  [place]

## 2017-02-02 NOTE — Progress Notes (Signed)
ANTICOAGULATION CONSULT NOTE - Initial Consult  Pharmacy Consult for Heparin  Indication: pulmonary embolus  Allergies  Allergen Reactions  . Ciprofloxacin Rash    Patient Measurements: Height: 6' (182.9 cm) Weight: 185 lb (83.9 kg) IBW/kg (Calculated) : 77.6 Heparin Dosing Weight: 83.9 kg   Vital Signs: Temp: 98.2 F (36.8 C) (01/04 2033) Temp Source: Oral (01/04 2033) BP: 147/62 (01/04 2033) Pulse Rate: 59 (01/04 2033)  Labs: Recent Labs    02/01/17 1516 02/01/17 1740 02/02/17 0146  HGB  --  14.5 13.9  HCT  --  43.3 41.6  PLT  --  199 190  APTT  --  28  --   LABPROT  --  14.5  --   INR  --  1.14  --   HEPARINUNFRC  --   --  0.80*  CREATININE 1.50* 1.29* 1.16  TROPONINI  --  <0.03  --     Estimated Creatinine Clearance: 59.5 mL/min (by C-G formula based on SCr of 1.16 mg/dL).   Medical History: Past Medical History:  Diagnosis Date  . BPH (benign prostatic hyperplasia)   . CAD (coronary artery disease)   . High cholesterol   . Nephrolithiasis   . Thyroid disease     Medications:  Medications Prior to Admission  Medication Sig Dispense Refill Last Dose  . aspirin EC 81 MG EC tablet Take 1 tablet (81 mg total) by mouth daily.   02/01/2017 at AM  . atorvastatin (LIPITOR) 40 MG tablet Take 40 mg by mouth daily.   01/31/2017 at PM  . clonazePAM (KLONOPIN) 1 MG tablet Take 0.5 mg by mouth at bedtime.    01/31/2017 at PM  . finasteride (PROSCAR) 5 MG tablet Take 5 mg by mouth daily.   02/01/2017 at AM  . levothyroxine (SYNTHROID, LEVOTHROID) 112 MCG tablet Take 112 mcg by mouth daily.   02/01/2017 at AM  . nitroGLYCERIN (NITROSTAT) 0.4 MG SL tablet Place 1 tablet (0.4 mg total) under the tongue every 5 (five) minutes as needed for chest pain. 30 tablet 0 PRN at PRN  . pantoprazole (PROTONIX) 40 MG tablet Take 40 mg by mouth daily.   02/01/2017 at AM  . QUEtiapine (SEROQUEL) 50 MG tablet Take 50 mg by mouth at bedtime.   01/31/2017 at PM  . ticagrelor (BRILINTA) 90 MG TABS  tablet Take 1 tablet (90 mg total) by mouth 2 (two) times daily. 60 tablet 0 02/01/2017 at AM    Assessment: Pharmacy consulted to dose heparin in this 77 year old male admitted with PE.  No prior anticoag noted.   CrCl = 46 ml/min   Goal of Therapy:  Heparin level 0.3-0.7 units/ml Monitor platelets by anticoagulation protocol: Yes   Plan:  Give 5000 units bolus x 1 Start heparin infusion at 1500 units/hr Check anti-Xa level in 8 hours and daily while on heparin Continue to monitor H&H and platelets  01/05 @ 0200 HL 0.80 supratherapeutic. Will decrease rate to 1400 units/hr and recheck HL @ 1000. CBC stable.  Tobie Lords, PharmD, BCPS Clinical Pharmacist 02/02/2017

## 2017-02-02 NOTE — Progress Notes (Signed)
ANTICOAGULATION CONSULT NOTE - Initial Consult  Pharmacy Consult for Heparin  Indication: pulmonary embolus  Allergies  Allergen Reactions  . Ciprofloxacin Rash    Patient Measurements: Height: 6' (182.9 cm) Weight: 185 lb (83.9 kg) IBW/kg (Calculated) : 77.6 Heparin Dosing Weight: 83.9 kg   Vital Signs: Temp: 97.6 F (36.4 C) (01/05 0311) Temp Source: Oral (01/05 0311) BP: 110/60 (01/05 0311) Pulse Rate: 53 (01/05 0311)  Labs: Recent Labs    02/01/17 1516 02/01/17 1740 02/02/17 0146 02/02/17 1021  HGB  --  14.5 13.9  --   HCT  --  43.3 41.6  --   PLT  --  199 190  --   APTT  --  28  --   --   LABPROT  --  14.5  --   --   INR  --  1.14  --   --   HEPARINUNFRC  --   --  0.80* 0.95*  CREATININE 1.50* 1.29* 1.16  --   TROPONINI  --  <0.03  --   --     Estimated Creatinine Clearance: 59.5 mL/min (by C-G formula based on SCr of 1.16 mg/dL).   Medical History: Past Medical History:  Diagnosis Date  . BPH (benign prostatic hyperplasia)   . CAD (coronary artery disease)   . High cholesterol   . Nephrolithiasis   . Thyroid disease     Medications:   Assessment: Pharmacy consulted to dose heparin in this 77 year old male admitted with PE.  No prior anticoag noted.     Goal of Therapy:  Heparin level 0.3-0.7 units/ml Monitor platelets by anticoagulation protocol: Yes   Plan:  Give 5000 units bolus x 1 Start heparin infusion at 1500 units/hr Check anti-Xa level in 8 hours and daily while on heparin Continue to monitor H&H and platelets  01/05 @ 0200 HL 0.80 supratherapeutic. Will decrease rate to 1400 units/hr and recheck HL @ 1000. CBC stable.  01/05 @ 1021 HL 0.95, supratherapeutic and increased despite rate decrease. RN confirms heparin running at 14 ml/hr; no s/sx of bleeding noted.  Will decrease rate to 1200 units/hr (=12 ml/hr) and recheck in 8h. CBC in AM.  Pharmacy will continue to follow.   Rayna Sexton, PharmD, BCPS Clinical  Pharmacist 02/02/2017 11:25 AM

## 2017-02-02 NOTE — Progress Notes (Signed)
ANTICOAGULATION CONSULT NOTE - Initial Consult  Pharmacy Consult for stop heparin gtt start apixaban for PE  Indication: pulmonary embolus  Allergies  Allergen Reactions  . Ciprofloxacin Rash    Patient Measurements: Height: 6' (182.9 cm) Weight: 185 lb (83.9 kg) IBW/kg (Calculated) : 77.6  Vital Signs: Temp: 97.6 F (36.4 C) (01/05 0311) Temp Source: Oral (01/05 0311) BP: 110/60 (01/05 0311) Pulse Rate: 53 (01/05 0311)  Labs: Recent Labs    02/01/17 1516 02/01/17 1740 02/02/17 0146 02/02/17 1021  HGB  --  14.5 13.9  --   HCT  --  43.3 41.6  --   PLT  --  199 190  --   APTT  --  28  --   --   LABPROT  --  14.5  --   --   INR  --  1.14  --   --   HEPARINUNFRC  --   --  0.80* 0.95*  CREATININE 1.50* 1.29* 1.16  --   TROPONINI  --  <0.03  --   --     Estimated Creatinine Clearance: 59.5 mL/min (by C-G formula based on SCr of 1.16 mg/dL).   Medical History: Past Medical History:  Diagnosis Date  . BPH (benign prostatic hyperplasia)   . CAD (coronary artery disease)   . High cholesterol   . Nephrolithiasis   . Thyroid disease     Medications:  Medications Prior to Admission  Medication Sig Dispense Refill Last Dose  . aspirin EC 81 MG EC tablet Take 1 tablet (81 mg total) by mouth daily.   02/01/2017 at AM  . atorvastatin (LIPITOR) 40 MG tablet Take 40 mg by mouth daily.   01/31/2017 at PM  . clonazePAM (KLONOPIN) 1 MG tablet Take 0.5 mg by mouth at bedtime.    01/31/2017 at PM  . finasteride (PROSCAR) 5 MG tablet Take 5 mg by mouth daily.   02/01/2017 at AM  . levothyroxine (SYNTHROID, LEVOTHROID) 112 MCG tablet Take 112 mcg by mouth daily.   02/01/2017 at AM  . nitroGLYCERIN (NITROSTAT) 0.4 MG SL tablet Place 1 tablet (0.4 mg total) under the tongue every 5 (five) minutes as needed for chest pain. 30 tablet 0 PRN at PRN  . pantoprazole (PROTONIX) 40 MG tablet Take 40 mg by mouth daily.   02/01/2017 at AM  . QUEtiapine (SEROQUEL) 50 MG tablet Take 50 mg by mouth at  bedtime.   01/31/2017 at PM  . ticagrelor (BRILINTA) 90 MG TABS tablet Take 1 tablet (90 mg total) by mouth 2 (two) times daily. 60 tablet 0 02/01/2017 at AM   Scheduled:  . apixaban  10 mg Oral BID   Followed by  . [START ON 02/09/2017] apixaban  5 mg Oral BID  . atorvastatin  40 mg Oral Daily  . clonazePAM  0.5 mg Oral QHS  . finasteride  5 mg Oral Daily  . levothyroxine  112 mcg Oral QAC breakfast  . pantoprazole  40 mg Oral Daily  . QUEtiapine  50 mg Oral QHS  . ticagrelor  90 mg Oral BID   Infusions:   PRN: acetaminophen **OR** acetaminophen, nitroGLYCERIN, ondansetron **OR** ondansetron (ZOFRAN) IV Anti-infectives (From admission, onward)   None      Assessment: 77 year old male with PE requiring anticoag with apixaban  Goal of Therapy:  Anticoagulation for PE  Monitor platelets by anticoagulation protocol: Yes   Plan:  Stop heparin gtt. Apixaban 10mg  po bid x 7 days followed by apixaban  5mg  po bid thereafter. Length of anticoag to be determined by MD. Pharmacy to monitor CBC and Scr   Donna Christen Aneisa Karren 02/02/2017,11:36 AM

## 2017-02-03 LAB — BASIC METABOLIC PANEL
Anion gap: 7 (ref 5–15)
BUN: 18 mg/dL (ref 6–20)
CALCIUM: 8.7 mg/dL — AB (ref 8.9–10.3)
CO2: 23 mmol/L (ref 22–32)
CREATININE: 1.33 mg/dL — AB (ref 0.61–1.24)
Chloride: 107 mmol/L (ref 101–111)
GFR calc non Af Amer: 50 mL/min — ABNORMAL LOW (ref >60)
GFR, EST AFRICAN AMERICAN: 58 mL/min — AB (ref >60)
GLUCOSE: 96 mg/dL (ref 65–99)
Potassium: 4 mmol/L (ref 3.5–5.1)
Sodium: 137 mmol/L (ref 135–145)

## 2017-02-03 LAB — CBC
HCT: 43.3 % (ref 40.0–52.0)
Hemoglobin: 14.5 g/dL (ref 13.0–18.0)
MCH: 30.8 pg (ref 26.0–34.0)
MCHC: 33.4 g/dL (ref 32.0–36.0)
MCV: 92.3 fL (ref 80.0–100.0)
PLATELETS: 200 10*3/uL (ref 150–440)
RBC: 4.69 MIL/uL (ref 4.40–5.90)
RDW: 13.6 % (ref 11.5–14.5)
WBC: 7.5 10*3/uL (ref 3.8–10.6)

## 2017-02-03 MED ORDER — APIXABAN 5 MG PO TABS: ORAL_TABLET | ORAL | 0 refills | Status: DC

## 2017-02-03 NOTE — Progress Notes (Signed)
Patient discharged via wheelchair and private vehicle. Tele box off and returned. No signs of distress noted at time of DC prescriptions given and eliquis coupon given.

## 2017-02-03 NOTE — Care Management Note (Signed)
Case Management Note  Patient Details  Name: Christopher Carlson MRN: [id number] Date of Birth: [birth year]  Subjective/Objective:      Provided Christopher Carlson with a 30 day Eliquis coupon              Action/Plan:   Expected Discharge Date:  02/03/17               Expected Discharge Plan:     In-House Referral:     Discharge planning Services     Post Acute Care Choice:    Choice offered to:     DME Arranged:    DME Agency:     HH Arranged:    HH Agency:     Status of Service:     If discussed at [employer] of Stay Meetings, dates discussed:    Additional Comments:  [name] A, RN 02/03/2017, 8:45 AM

## 2017-02-03 NOTE — Discharge Summary (Signed)
Christopher Carlson NAME: Christopher Carlson    MR#:  [id number]  DATE OF BIRTH:  1940-06-07  DATE OF ADMISSION:  02/01/2017 ADMITTING PHYSICIAN: Henreitta Leber, MD  DATE OF DISCHARGE: 02/03/2017 10:57 AM  PRIMARY CARE PHYSICIAN: [place] Pink, MD    ADMISSION DIAGNOSIS:  Other acute pulmonary embolism with acute cor pulmonale ([place]) [I26.09]  DISCHARGE DIAGNOSIS:  Active Problems:   Pulmonary embolism ([place])   SECONDARY DIAGNOSIS:   Past Medical History:  Diagnosis Date  . BPH (benign prostatic hyperplasia)   . CAD (coronary artery disease)   . High cholesterol   . Nephrolithiasis   . Thyroid disease     HOSPITAL COURSE:   1.  Acute pulmonary embolism and left lower extremity DVT.  The patient was admitted and put on heparin drip.  I switched over to Eliquis.  The patient did well with the Eliquis. The patient shortness of breath has improved.  The patient had pulmonary embolism diagnosed on a CT scan of the abdomen for hematuria.  I did not order a CT scan of the chest because the patient's creatinine is a little borderline and I did not want to give two contrasted studies on the hospital same hospitalization.  Ultrasound of the lower extremity was positive for DVT on the left.  Benefits and risks of blood thinner explained to the patient and daughter.  Bleeding is a major side effect. 2.  Hematuria.  Our urinalysis here did show small amount of blood on the urinalysis.  He follows with Dr. Pilar Jarvis as outpatient.  Continue follow-up with urology as outpatient.  He will have to have his hemoglobin checked every few weeks to make sure it is not dropping down. 3.  Hyperlipidemia unspecified on atorvastatin 4.  History of CAD on Brilinta.  Aspirin is stopped since he was also on Eliquis now.  No beta-blocker with bradycardia.  On atorvastatin. 5.  BPH on finasteride 6.  Hypothyroidism unspecified on levothyroxine 7.  Chronic kidney disease stage  III  DISCHARGE CONDITIONS:   Satisfactory  CONSULTS OBTAINED:  Case discussed with urology.  DRUG ALLERGIES:   Allergies  Allergen Reactions  . Ciprofloxacin Rash    DISCHARGE MEDICATIONS:   Allergies as of 02/03/2017      Reactions   Ciprofloxacin Rash      Medication List    STOP taking these medications   aspirin 81 MG EC tablet     TAKE these medications   apixaban 5 MG Tabs tablet Commonly known as:  ELIQUIS 2 tablets oral twice a day for six days, then 1 tablet twice a day afterwards   atorvastatin 40 MG tablet Commonly known as:  LIPITOR Take 40 mg by mouth daily.   clonazePAM 1 MG tablet Commonly known as:  KLONOPIN Take 0.5 mg by mouth at bedtime.   finasteride 5 MG tablet Commonly known as:  PROSCAR Take 5 mg by mouth daily.   levothyroxine 112 MCG tablet Commonly known as:  SYNTHROID, LEVOTHROID Take 112 mcg by mouth daily.   nitroGLYCERIN 0.4 MG SL tablet Commonly known as:  NITROSTAT Place 1 tablet (0.4 mg total) under the tongue every 5 (five) minutes as needed for chest pain.   pantoprazole 40 MG tablet Commonly known as:  PROTONIX Take 40 mg by mouth daily.   QUEtiapine 50 MG tablet Commonly known as:  SEROQUEL Take 50 mg by mouth at bedtime.   ticagrelor 90 MG Tabs tablet Commonly known  as:  BRILINTA Take 1 tablet (90 mg total) by mouth 2 (two) times daily.        DISCHARGE INSTRUCTIONS:   Follow-up with urology as already scheduled Follow-up with PMD 1 week  If you experience worsening of your admission symptoms, develop shortness of breath, life threatening emergency, suicidal or homicidal thoughts you must seek medical attention immediately by calling 911 or calling your MD immediately  if symptoms less severe.  You Must read complete instructions/literature along with all the possible adverse reactions/side effects for all the Medicines you take and that have been prescribed to you. Take any new Medicines after you have  completely understood and accept all the possible adverse reactions/side effects.   Please note  You were cared for by a hospitalist during your hospital stay. If you have any questions about your discharge medications or the care you received while you were in the hospital after you are discharged, you can call the unit and asked to speak with the hospitalist on call if the hospitalist that took care of you is not available. Once you are discharged, your primary care physician will handle any further medical issues. Please note that NO REFILLS for any discharge medications will be authorized once you are discharged, as it is imperative that you return to your primary care physician (or establish a relationship with a primary care physician if you do not have one) for your aftercare needs so that they can reassess your need for medications and monitor your lab values.    Today   CHIEF COMPLAINT:   Chief Complaint  Patient presents with  . Pulmonary Embolism    HISTORY OF PRESENT ILLNESS:  Christopher Carlson  is a 77 y.o. male sent into the hospital for pulmonary embolism   VITAL SIGNS:  Blood pressure 114/77, pulse 66, temperature 97.6 F (36.4 C), resp. rate 18, height 6' (1.829 m), weight 83.9 kg (185 lb), SpO2 92 %.    PHYSICAL EXAMINATION:  GENERAL:  77 y.o.-year-old patient lying in the bed with no acute distress.  EYES: Pupils equal, round, reactive to light and accommodation. No scleral icterus. Extraocular muscles intact.  HEENT: Head atraumatic, normocephalic. Oropharynx and nasopharynx clear.  NECK:  Supple, no jugular venous distention. No thyroid enlargement, no tenderness.  LUNGS: Normal breath sounds bilaterally, no wheezing, rales,rhonchi or crepitation. No use of accessory muscles of respiration.  CARDIOVASCULAR: S1, S2 normal. No murmurs, rubs, or gallops.  ABDOMEN: Soft, non-tender, non-distended. Bowel sounds present. No organomegaly or mass.  EXTREMITIES: No pedal  edema, cyanosis, or clubbing.  NEUROLOGIC: Cranial nerves II through XII are intact. Muscle strength 5/5 in all extremities. Sensation intact. Gait not checked.  PSYCHIATRIC: The patient is alert and oriented x 3.  SKIN: No obvious rash, lesion, or ulcer.   DATA REVIEW:   CBC Recent Labs  Lab 02/03/17 0553  WBC 7.5  HGB 14.5  HCT 43.3  PLT 200    Chemistries  Recent Labs  Lab 02/01/17 1740  02/03/17 0553  NA 136   < > 137  K 4.0   < > 4.0  CL 105   < > 107  CO2 21*   < > 23  GLUCOSE 97   < > 96  BUN 21*   < > 18  CREATININE 1.29*   < > 1.33*  CALCIUM 8.5*   < > 8.7*  AST 19  --   --   ALT 12*  --   --  ALKPHOS 47  --   --   BILITOT 0.8  --   --    < > = values in this interval not displayed.    Cardiac Enzymes Recent Labs  Lab 02/01/17 1740  TROPONINI <0.03    Microbiology Results  Results for orders placed or performed in visit on 01/11/17  Microscopic Examination     Status: Abnormal   Collection Time: 01/11/17  8:54 AM  Result Value Ref Range Status   WBC, UA None seen 0 - 5 /hpf Final   RBC, UA None seen 0 - 2 /hpf Final   Epithelial Cells (non renal) None seen 0 - 10 /hpf Final   Casts Present (A) None seen /lpf Final   Cast Type Hyaline casts N/A Final   Mucus, UA Present (A) Not Estab. Final   Bacteria, UA Moderate (A) None seen/Few Final    RADIOLOGY:  US Venous Img Lower Bilateral  Result Date: 02/02/2017 CLINICAL DATA:  Bilateral lower extremity edema. History of prior pulmonary embolism. Evaluate for DVT. EXAM: BILATERAL LOWER EXTREMITY VENOUS DOPPLER ULTRASOUND TECHNIQUE: Gray-scale sonography with graded compression, as well as color Doppler and duplex ultrasound were performed to evaluate the lower extremity deep venous systems from the level of the common femoral vein and including the common femoral, femoral, profunda femoral, popliteal and calf veins including the posterior tibial, peroneal and gastrocnemius veins when visible. The  superficial great saphenous vein was also interrogated. Spectral Doppler was utilized to evaluate flow at rest and with distal augmentation maneuvers in the common femoral, femoral and popliteal veins. COMPARISON:  None. FINDINGS: RIGHT LOWER EXTREMITY Common Femoral Vein: No evidence of thrombus. Normal compressibility, respiratory phasicity and response to augmentation. Saphenofemoral Junction: No evidence of thrombus. Normal compressibility and flow on color Doppler imaging. Profunda Femoral Vein: No evidence of thrombus. Normal compressibility and flow on color Doppler imaging. Femoral Vein: No evidence of thrombus. Normal compressibility, respiratory phasicity and response to augmentation. Popliteal Vein: No evidence of thrombus. Normal compressibility, respiratory phasicity and response to augmentation. Calf Veins: No evidence of thrombus. Normal compressibility and flow on color Doppler imaging. Superficial Great Saphenous Vein: No evidence of thrombus. Normal compressibility. Venous Reflux:  None. Other Findings:  None. LEFT LOWER EXTREMITY Common Femoral Vein: No evidence of thrombus. Normal compressibility, respiratory phasicity and response to augmentation. Saphenofemoral Junction: No evidence of thrombus. Normal compressibility and flow on color Doppler imaging. Profunda Femoral Vein: No evidence of thrombus. Normal compressibility and flow on color Doppler imaging. Femoral Vein: No evidence of thrombus. Normal compressibility, respiratory phasicity and response to augmentation. Popliteal Vein: There is hypoechoic, expansile near occlusive DVT within the left popliteal vein (images 54 through 60). Calf Veins: No evidence of thrombus. Normal compressibility and flow on color Doppler imaging. Superficial Great Saphenous Vein: No evidence of thrombus. Normal compressibility. Venous Reflux:  None. Other Findings:  None. IMPRESSION: 1. Examination is positive for short segment near occlusive DVT involving the  left popliteal vein. 2. No evidence of acute or chronic DVT within the right lower extremity. Electronically Signed   By: Sandi Mariscal M.D.   On: 02/02/2017 09:23   Ct Hematuria Workup  Result Date: 02/01/2017 CLINICAL DATA:  Gross hematuria. History of renal calculi. Soreness. Recent urinary tract infection. EXAM: CT ABDOMEN AND PELVIS WITHOUT AND WITH CONTRAST TECHNIQUE: Multidetector CT imaging of the abdomen and pelvis was performed following the standard protocol before and following the bolus administration of intravenous contrast. CONTRAST:  129mL ISOVUE-300 IOPAMIDOL (ISOVUE-300)  INJECTION 61% COMPARISON:  None. FINDINGS: Lower chest: 3 by 5 mm and adjacent 3 by 3 mm left lower lobe pulmonary nodules, image 8/6. 3 mm right middle lobe pulmonary nodule on image 5/17. 3 mm subpleural nodule in the left lower lobe on image 26/17. Circumflex coronary artery atherosclerotic calcification. There is abnormal filling defect in both lower lobe pulmonary arteries compatible with pulmonary embolus, visible on the portal venous and the delayed phase images. Hepatobiliary: Mildly contracted gallbladder. Otherwise unremarkable. Pancreas: Unremarkable Spleen: Remarkable Adrenals/Urinary Tract: Nonobstructive 4 mm right kidney lower pole calculus. Nonobstructive 2 mm left kidney lower pole calculus. No ureteral or bladder calculus. No hydronephrosis or hydroureter. No significant appreciable filling defects along the urothelium. A 8 mm left kidney lower pole hypodense lesion on image 65/10 is statistically likely to be a small cyst but technically nonspecific. Similar nonspecific 4 mm hypodense lesion in the left kidney upper pole. Stomach/Bowel: Small type 1 hiatal hernia. Descending and sigmoid colon diverticulosis without active diverticulitis. Appendix normal. Vascular/Lymphatic: Aortoiliac atherosclerotic vascular disease. No obvious pelvic DVT. No pathologic adenopathy in the abdomen or pelvis. Reproductive:  Prominently enlarged prostate gland, measuring 6.8 by 5.9 by 7.6 cm (volume = 160 cm^3). Dense calcification in the apical peripheral zone, right greater than left. Other: No supplemental non-categorized findings. Musculoskeletal: Lumbar spondylosis and degenerative disc disease causing mild impingement at L4-5 and L5-S 1. Grade 1 degenerative anterolisthesis at L4-5 with vacuum disc phenomenon. Small direct right inguinal hernia contains adipose tissue. IMPRESSION: 1. Filling defect in both lower lobe pulmonary arteries compatible with pulmonary embolus. Not entirely age determinate but this could well be acute pulmonary embolus based on central positioning of some of the filling defect. Right ventricular to left ventricular ratio 0.91, consistent with at least submassive (intermediate risk) PE. The presence of right heart strain has been associated with an increased risk of morbidity and mortality. I discussed this with Dr. Pilar Jarvis and he plans to send the patient immediately to the emergency department. This patient qualifies for activation of Code PE by paging [phone]. 2. Bilateral nonobstructive nephrolithiasis. 3. Markedly enlarged prostate gland, volume estimated at 1:00 60 cubic cm. Dense calcification in the apical peripheral zone, right greater than left. 4. Small bibasilar pulmonary nodules in the 3-4 mm range. No follow-up needed if patient is low-risk (and has no known or suspected primary neoplasm). Non-contrast chest CT can be considered in 12 months if patient is high-risk. This recommendation follows the consensus statement: Guidelines for Management of Incidental Pulmonary Nodules Detected on CT Images: From the Fleischner Society 2017; Radiology 2017; 284:228-243. 5.  Aortic Atherosclerosis (ICD10-I70.0).  Coronary atherosclerosis. 6. Other imaging findings of potential clinical significance: Small type 1 hiatal hernia. Descending and sigmoid colon diverticulosis. Lumbar spondylosis and  degenerative disc disease causing mild impingement at L4-5 and L5-S1. Small direct right inguinal hernia containing adipose tissue. Critical Value/emergent results were called by telephone at the time of interpretation on 02/01/2017 at 4:06 pm to Dr. Baruch Gouty , who verbally acknowledged these results. Electronically Signed   By: Van Clines M.D.   On: 02/01/2017 16:15     Management plans discussed with the patient, and he is in agreement.  CODE STATUS:  Code Status History    Date Active Date Inactive Code Status Order ID Comments User Context   02/01/2017 20:27 02/03/2017 14:42 Full Code [id number]  Henreitta Leber, MD Inpatient   10/10/2016 23:53 10/13/2016 18:28 Full Code [id number]  Harvie Bridge, DO Inpatient  Advance Directive Documentation     Most Recent Value  Type of Advance Directive  Healthcare Power of Attorney, Living will  Pre-existing out of facility DNR order (yellow form or pink MOST form)  No data  "MOST" Form in Place?  No data      TOTAL TIME TAKING CARE OF THIS PATIENT: 35 minutes.    Loletha Grayer M.D on 02/03/2017 at 3:27 PM  Between 7am to 6pm - Pager - [phone]  After 6pm go to [email or web address] - password [email or web address] [facility]  [phone]  CC: Primary care physician; [place] Pink, MD

## 2017-02-03 NOTE — Discharge Instructions (Signed)
Apixaban oral tablets °What is this medicine? °APIXABAN (a PIX a ban) is an anticoagulant (blood thinner). It is used to lower the chance of stroke in people with a medical condition called atrial fibrillation. It is also used to treat or prevent blood clots in the lungs or in the veins. °This medicine may be used for other purposes; ask your health care provider or pharmacist if you have questions. °COMMON BRAND NAME(S): Eliquis °What should I tell my health care provider before I take this medicine? °They need to know if you have any of these conditions: °-bleeding disorders °-bleeding in the brain °-blood in your stools (black or tarry stools) or if you have blood in your vomit °-history of stomach bleeding °-kidney disease °-liver disease °-mechanical heart valve °-an unusual or allergic reaction to apixaban, other medicines, foods, dyes, or preservatives °-pregnant or trying to get pregnant °-breast-feeding °How should I use this medicine? °Take this medicine by mouth with a glass of water. Follow the directions on the prescription label. You can take it with or without food. If it upsets your stomach, take it with food. Take your medicine at regular intervals. Do not take it more often than directed. Do not stop taking except on your doctor's advice. Stopping this medicine may increase your risk of a blot clot. Be sure to refill your prescription before you run out of medicine. °Talk to your pediatrician regarding the use of this medicine in children. Special care may be needed. °Overdosage: If you think you have taken too much of this medicine contact a poison control center or emergency room at once. °NOTE: This medicine is only for you. Do not share this medicine with others. °What if I miss a dose? °If you miss a dose, take it as soon as you can. If it is almost time for your next dose, take only that dose. Do not take double or extra doses. °What may interact with this medicine? °This medicine may  interact with the following: °-aspirin and aspirin-like medicines °-certain medicines for fungal infections like ketoconazole and itraconazole °-certain medicines for seizures like carbamazepine and phenytoin °-certain medicines that treat or prevent blood clots like warfarin, enoxaparin, and dalteparin °-clarithromycin °-NSAIDs, medicines for pain and inflammation, like ibuprofen or naproxen °-rifampin °-ritonavir °-St. John's wort °This list may not describe all possible interactions. Give your health care provider a list of all the medicines, herbs, non-prescription drugs, or dietary supplements you use. Also tell them if you smoke, drink alcohol, or use illegal drugs. Some items may interact with your medicine. °What should I watch for while using this medicine? °Visit your doctor or health care professional for regular checks on your progress. °Notify your doctor or health care professional and seek emergency treatment if you develop breathing problems; changes in vision; chest pain; severe, sudden headache; pain, swelling, warmth in the leg; trouble speaking; sudden numbness or weakness of the face, arm or leg. These can be signs that your condition has gotten worse. °If you are going to have surgery or other procedure, tell your doctor that you are taking this medicine. °What side effects may I notice from receiving this medicine? °Side effects that you should report to your doctor or health care professional as soon as possible: °-allergic reactions like skin rash, itching or hives, swelling of the face, lips, or tongue °-signs and symptoms of bleeding such as bloody or black, tarry stools; red or dark-brown urine; spitting up blood or brown material that looks like coffee   grounds; red spots on the skin; unusual bruising or bleeding from the eye, gums, or nose This list may not describe all possible side effects. Call your doctor for medical advice about side effects. You may report side effects to FDA at  1-800-FDA-1088. Where should I keep my medicine? Keep out of the reach of children. Store at room temperature between 20 and 25 degrees C (68 and 77 degrees F). Throw away any unused medicine after the expiration date. NOTE: This sheet is a summary. It may not cover all possible information. If you have questions about this medicine, talk to your doctor, pharmacist, or health care provider.  2018 Elsevier/Gold Standard (2015-08-08 11:54:23) Pulmonary Embolism A pulmonary embolism (PE) is a sudden blockage or decrease of blood flow in one lung or both lungs. Most blockages come from a blood clot that forms in a lower leg, thigh, or arm vein (deep vein thrombosis, DVT) and travels to the lungs. A clot is blood that has thickened into a gel or solid. PE is a dangerous and life-threatening condition that needs to be treated right away. What are the causes? This condition is usually caused by a blood clot that forms in a vein and moves to the lungs. In rare cases, it may be caused by air, fat, part of a tumor, or other tissue that moves through the veins and into the lungs. What increases the risk? The following factors may make you more likely to develop this condition:  Having DVT or a history of DVT.  Being older than age 39.  Personal or family history of blood clots or blood clotting disease.  Major or lengthy surgery.  Orthopedic surgery, especially hip or knee replacement.  Traumatic injury, such as breaking a hip or leg.  Spinal cord injury.  Stroke.  Taking medicines that contain estrogen. These include birth control pills and hormone replacement therapy.  Long-term (chronic) lung or heart disease.  Cancer and chemotherapy.  Having a central venous catheter.  Pregnancy and the period after delivery.  What are the signs or symptoms? Symptoms of this condition usually start suddenly and include:  Shortness of breath while active or at rest.  Coughing or coughing up  blood or blood-tinged mucus.  Chest pain that is often worse with deep breaths.  Rapid or irregular heartbeat.  Feeling light-headed or dizzy.  Fainting.  Feeling anxious.  Sweating.  Pain and swelling in a leg. This is a symptom of DVT, which can lead to PE.  How is this diagnosed? This condition may be diagnosed based on:  Your medical history.  A physical exam.  Blood tests to check blood oxygen level and how well your blood clots, and a D-dimer blood test, which checks your blood for a substance that is released when a blood clot breaks apart.  CT pulmonary angiogram. This test checks blood flow in and around your lungs.  Ventilation-perfusion scan, also called a lung VQ scan. This test measures air flow and blood flow to the lungs.  Ultrasound of the legs to look for blood clots.  How is this treated? Treatment for this conditions depends on many factors, such as the cause of your PE, your risk for bleeding or developing more clots, and other medical conditions you have. Treatment aims to remove, dissolve, or stop blood clots from forming or growing larger. Treatment may include:  Blood thinning medicines (anticoagulants) to stop clots from forming or growing. These medicines may be given as a pill, as an  injection, or through an IV tube (infusion).  Medicines that dissolve clots (thrombolytics).  A procedure in which a flexible tube is used to remove a blood clot (embolectomy) or deliver medicine to destroy it (catheter-directed thrombolysis).  A procedure in which a filter is inserted into a large vein that carries blood to the heart (inferior vena cava). This filter (vena cava filter) catches blood clots before they reach the lungs.  Surgery to remove the clot (surgical embolectomy). This is rare.  You may need a combination of immediate, long-term (up to 3 months after diagnosis), and extended (more than 3 months after diagnosis) treatments. Your treatment may  continue for several months (maintenance therapy). You and your health care provider will work together to choose the treatment program that is best for you. Follow these instructions at home: If you are taking an anticoagulant medicine:  Take the medicine every day at the same time each day.  Understand what foods and drugs interact with your medicine.  Understand the side effects of this medicine, including excessive bruising or bleeding. Ask your health care provider or pharmacist about other side effects. General instructions  Take over-the-counter and prescription medicines only as told by your health care provider.  Anticoagulant medicines may cause side effects, including easy bruising and difficulty stopping bleeding. If you are prescribed an anticoagulant: ? Hold pressure over cuts for longer than usual. ? Tell your dentist and other health care providers that you are taking anticoagulants before you have any procedure that may cause bleeding. ? Avoid contact sports. ? Be extra careful when handling sharp objects. ? Use a soft toothbrush. Floss with waxed dental floss. ? Shave with an Copy.  Wear a medical alert bracelet or carry a medical alert card that says you have had a PE.  Ask your health care provider when you may return to your normal activities.  Talk with your health care provider about any travel plans. It is important to make sure that you are still able to take your medicine while on trips.  Keep all follow-up visits as told by your health care provider. This is important. How is this prevented? Take these actions to lower your risk of developing another PE:  Exercise regularly. Take frequent walks. For at least 30 minutes every day, engage in: ? Activity that involves moving your arms and legs. ? Activity that encourages good blood flow through your body by increasing your heart rate.  While traveling, drink plenty of water and avoid drinking  alcohol. Ask your health care provider if you should wear below-the-knee compression stockings.  Avoid sitting or lying in bed for long periods of time without moving your legs. Exercise your arms and legs every hour during long-distance travel (over 4 hours).  If you are hospitalized or have surgery, ask your health care provider about your risks and what treatments can help prevent blood clots.  Maintain a healthy weight. Ask your health care provider what weight is healthy for you.  If you are a woman who is over age 44, avoid unnecessary use of medicines that contain estrogen, including birth control pills.  Do not use any products that contain nicotine or tobacco, such as cigarettes and e-cigarettes. This is especially important if you take estrogen medicines. If you need help quitting, ask your health care provider.  See your health care provider for regular checkups. This may include blood tests and ultrasound testing on your legs to check for new blood clots.  Contact  a health care provider if:  You missed a dose of your blood thinner medicine. Get help right away if:  You have new or increased pain, swelling, warmth, or redness in an arm or leg.  You have numbness or tingling in an arm or leg.  You have shortness of breath while active or at rest.  You have chest pain.  You have a rapid or irregular heartbeat.  You feel light-headed or dizzy.  You cough up blood.  You have blood in your vomit, stool, or urine.  You have a fever.  You have abdomen (abdominal) pain.  You have a severe fall or head injury.  You have a severe headache.  You have vision changes.  You cannot move your arms or legs.  You are confused or have memory loss.  You are bleeding for 10 minutes or more, even with strong pressure on the wound. These symptoms may represent a serious problem that is an emergency. Do not wait to see if the symptoms will go away. Get medical help right away.  Call your local emergency services (911 in the [place] Do not drive yourself to the hospital. Summary  A pulmonary embolism (PE) is a sudden blockage or decrease of blood flow in one lung or both lungs. PE is a dangerous and life-threatening condition that needs to be treated right away.  Having deep vein thrombosis (DVT) or a history of DVT is the most common risk factor for PE.  Treatments for this condition usually include medicines to thin your blood (anticoagulants) or medicines to break apart blood clots (thrombolytics).  If you are prescribed blood thinners, it is important to take the medicine every single day at the same time each day.  If you have signs of PE or DVT, call your local emergency services (911 in the [place] This information is not intended to replace advice given to you by your health care provider. Make sure you discuss any questions you have with your health care provider. Document Released: 01/13/2000 Document Revised: 02/18/2016 Document Reviewed: 02/18/2016 Elsevier Interactive Patient Education  2018 [employer].

## 2017-02-06 DIAGNOSIS — I82402 Acute embolism and thrombosis of unspecified deep veins of left lower extremity: Secondary | ICD-10-CM | POA: Diagnosis not present

## 2017-02-06 DIAGNOSIS — I2699 Other pulmonary embolism without acute cor pulmonale: Secondary | ICD-10-CM | POA: Diagnosis not present

## 2017-02-07 ENCOUNTER — Other Ambulatory Visit: Payer: Self-pay | Admitting: *Deleted

## 2017-02-07 ENCOUNTER — Encounter: Payer: Self-pay | Admitting: *Deleted

## 2017-02-07 ENCOUNTER — Other Ambulatory Visit: Payer: Medicare HMO

## 2017-02-07 NOTE — Patient Outreach (Signed)
[facility]) Care Management  02/07/2017  Nichoals Heyde [birth year] [id number]  Referral via [place]; patient discharged from [facility]   Telephone call to patient who was advised of reason for call & of [facility] care management services.   Patient states she does not need case management services. States his daughter & doctors provide his healthcare.  Patient declined transition of care assessment & [facility] services.     Plan: Send MD closure letter. Send to care management assistant for case closure.  Sherrin Daisy, RN BSN [place] Management Coordinator [facility] Care Management  [phone]

## 2017-02-19 DIAGNOSIS — Z86718 Personal history of other venous thrombosis and embolism: Secondary | ICD-10-CM | POA: Diagnosis not present

## 2017-02-19 DIAGNOSIS — Z125 Encounter for screening for malignant neoplasm of prostate: Secondary | ICD-10-CM | POA: Diagnosis not present

## 2017-02-19 DIAGNOSIS — Z86711 Personal history of pulmonary embolism: Secondary | ICD-10-CM | POA: Diagnosis not present

## 2017-02-25 DIAGNOSIS — M79674 Pain in right toe(s): Secondary | ICD-10-CM | POA: Diagnosis not present

## 2017-02-25 DIAGNOSIS — B351 Tinea unguium: Secondary | ICD-10-CM | POA: Diagnosis not present

## 2017-02-25 DIAGNOSIS — L600 Ingrowing nail: Secondary | ICD-10-CM | POA: Diagnosis not present

## 2017-02-25 DIAGNOSIS — M79675 Pain in left toe(s): Secondary | ICD-10-CM | POA: Diagnosis not present

## 2017-03-08 ENCOUNTER — Other Ambulatory Visit: Payer: Self-pay

## 2017-03-08 ENCOUNTER — Other Ambulatory Visit
Admission: RE | Admit: 2017-03-08 | Discharge: 2017-03-08 | Disposition: A | Discharge status: Home / Self Care | Payer: Medicare HMO | Source: Ambulatory Visit | Attending: Urology | Admitting: Urology

## 2017-03-08 ENCOUNTER — Encounter: Payer: Self-pay | Admitting: Urology

## 2017-03-08 ENCOUNTER — Ambulatory Visit (INDEPENDENT_AMBULATORY_CARE_PROVIDER_SITE_OTHER): Payer: Medicare HMO | Admitting: Urology

## 2017-03-08 VITALS — BP 139/71 | HR 66 | Ht 72.0 in | Wt 190.0 lb

## 2017-03-08 DIAGNOSIS — R310 Gross hematuria: Secondary | ICD-10-CM | POA: Diagnosis not present

## 2017-03-08 DIAGNOSIS — R319 Hematuria, unspecified: Secondary | ICD-10-CM | POA: Diagnosis not present

## 2017-03-08 DIAGNOSIS — N138 Other obstructive and reflux uropathy: Secondary | ICD-10-CM | POA: Diagnosis not present

## 2017-03-08 DIAGNOSIS — R918 Other nonspecific abnormal finding of lung field: Secondary | ICD-10-CM

## 2017-03-08 DIAGNOSIS — N401 Enlarged prostate with lower urinary tract symptoms: Secondary | ICD-10-CM

## 2017-03-08 DIAGNOSIS — N99111 Postprocedural bulbous urethral stricture: Secondary | ICD-10-CM

## 2017-03-08 LAB — URINALYSIS, COMPLETE (UACMP) WITH MICROSCOPIC
Bilirubin Urine: NEGATIVE
Glucose, UA: NEGATIVE mg/dL
Ketones, ur: NEGATIVE mg/dL
Nitrite: NEGATIVE
PROTEIN: NEGATIVE mg/dL
Specific Gravity, Urine: 1.015 (ref 1.005–1.030)
pH: 6 (ref 5.0–8.0)

## 2017-03-08 NOTE — Progress Notes (Signed)
   03/09/17  CC:  Chief Complaint  Patient presents with  . Cysto    HPI: 77 year old male with BPH on finasteride, history of elevated PSA who presents the office today for cystoscopy to complete his gross hematuria workup.  At the time of CT urogram, he was noted to have a massive saddle embolus to the lungs which led to an emergent admission and anticoagulation.  Just prior to this, he had an MI in September 2018.  He is now on Eliquis and Brilinta.  His most recent PSA was 5.84 on 02/19/2017.  This is down significantly from his previous baseline of around 12 per his report.  He has had 2- prostate biopsies in the past.  CT scan prostate volume estimated around 160 cc.  He was previously followed by a urologist in [place].  He has few voiding complaints today.  He is doing well on finasteride.  Blood pressure 139/71, pulse 66, height 6' (1.829 m), weight 190 lb (86.2 kg). NED. A&Ox3.   No respiratory distress   Abd soft, NT, ND Normal phallus with bilateral descended testicles  Cystoscopy Procedure Note  Patient identification was confirmed, informed consent was obtained, and patient was prepped using Betadine solution.  Lidocaine jelly was administered per urethral meatus.    Preoperative abx where received prior to procedure.     Pre-Procedure: - Inspection reveals a normal caliber ureteral meatus.  Procedure: The flexible cystoscope was introduced without difficulty - N normal pendulous urethra, however, approximately 8 French bulbar urethral stricture was encountered through which the scope was unable to be passed   Post-Procedure: - Patient tolerated the procedure well  Assessment/ Plan:  1. Benign prostatic hyperplasia with urinary obstruction Massively enlarged prostate on finasteride Relatively asymptomatic Will request records from previous urologist in [place]  2. Gross hematuria Upper tract imaging otherwise unremarkable other than for massively  enlarged prostate as well as bilateral small nonobstructing renal calculi  Unable to access the prostatic fossa/bladder today due to presence of bulbar urethral stricture  In the setting of dual antiplatelet/anticoagulation, office dilation is ill advised at this time and is not a surgical candidate to proceed with this in the operating room  Will defer cystoscopy at this time as gross hematuria likely related to massively enlarged prostate  We will send urine cytology today and only consider intervention if this is abnormal prior to his 6 months of anticoagulation following massive PE  3. Postprocedural bulbous urethral stricture Asymptomatic approximately 8 French bulbar urethral stricture, management of his above (conservative at this time)  4. Pulmonary nodules Advised to follow-up with PCP regarding pulmonary nodules Recommend either no further follow-up versus repeat CT in 2 years if deemed high risk per PCP  Return in about 5 months (around 08/05/2017).  Request records from urologist in [place]   Gari Hartsell, MD

## 2017-03-13 ENCOUNTER — Other Ambulatory Visit: Payer: Self-pay | Admitting: Urology

## 2017-04-22 DIAGNOSIS — I2690 Septic pulmonary embolism without acute cor pulmonale: Secondary | ICD-10-CM | POA: Diagnosis not present

## 2017-04-22 DIAGNOSIS — K219 Gastro-esophageal reflux disease without esophagitis: Secondary | ICD-10-CM | POA: Diagnosis not present

## 2017-04-22 DIAGNOSIS — I214 Non-ST elevation (NSTEMI) myocardial infarction: Secondary | ICD-10-CM | POA: Diagnosis not present

## 2017-04-22 DIAGNOSIS — R55 Syncope and collapse: Secondary | ICD-10-CM | POA: Diagnosis not present

## 2017-04-22 DIAGNOSIS — I2510 Atherosclerotic heart disease of native coronary artery without angina pectoris: Secondary | ICD-10-CM | POA: Diagnosis not present

## 2017-04-22 DIAGNOSIS — E782 Mixed hyperlipidemia: Secondary | ICD-10-CM | POA: Diagnosis not present

## 2017-04-22 DIAGNOSIS — G4700 Insomnia, unspecified: Secondary | ICD-10-CM | POA: Diagnosis not present

## 2017-04-22 DIAGNOSIS — I2782 Chronic pulmonary embolism: Secondary | ICD-10-CM | POA: Diagnosis not present

## 2017-04-22 DIAGNOSIS — I10 Essential (primary) hypertension: Secondary | ICD-10-CM | POA: Diagnosis not present

## 2017-04-22 DIAGNOSIS — Z95820 Peripheral vascular angioplasty status with implants and grafts: Secondary | ICD-10-CM | POA: Diagnosis not present

## 2017-04-25 ENCOUNTER — Encounter: Payer: Self-pay | Admitting: Oncology

## 2017-04-25 ENCOUNTER — Inpatient Hospital Stay: Payer: Medicare HMO

## 2017-04-25 ENCOUNTER — Inpatient Hospital Stay: Payer: Medicare HMO | Attending: Oncology | Admitting: Oncology

## 2017-04-25 VITALS — BP 125/62 | HR 60 | Temp 97.9°F | Resp 20 | Wt 194.1 lb

## 2017-04-25 DIAGNOSIS — I82492 Acute embolism and thrombosis of other specified deep vein of left lower extremity: Secondary | ICD-10-CM

## 2017-04-25 DIAGNOSIS — I82402 Acute embolism and thrombosis of unspecified deep veins of left lower extremity: Secondary | ICD-10-CM | POA: Diagnosis not present

## 2017-04-25 DIAGNOSIS — Z87891 Personal history of nicotine dependence: Secondary | ICD-10-CM

## 2017-04-25 DIAGNOSIS — N400 Enlarged prostate without lower urinary tract symptoms: Secondary | ICD-10-CM | POA: Insufficient documentation

## 2017-04-25 DIAGNOSIS — E7800 Pure hypercholesterolemia, unspecified: Secondary | ICD-10-CM | POA: Diagnosis not present

## 2017-04-25 DIAGNOSIS — I2699 Other pulmonary embolism without acute cor pulmonale: Secondary | ICD-10-CM

## 2017-04-25 DIAGNOSIS — I2510 Atherosclerotic heart disease of native coronary artery without angina pectoris: Secondary | ICD-10-CM | POA: Diagnosis not present

## 2017-04-25 DIAGNOSIS — E079 Disorder of thyroid, unspecified: Secondary | ICD-10-CM | POA: Insufficient documentation

## 2017-04-25 NOTE — Progress Notes (Signed)
Patient here today as new evaluation for pulmonary embolism.

## 2017-04-26 LAB — HOMOCYSTEINE: Homocysteine: 15.9 umol/L — ABNORMAL HIGH (ref 0.0–15.0)

## 2017-04-27 LAB — CARDIOLIPIN ANTIBODIES, IGG, IGM, IGA
Anticardiolipin IgA: <9 APL U/mL (ref 0–11)
Anticardiolipin IgG: <9 GPL U/mL (ref 0–14)
Anticardiolipin IgM: <9 MPL U/mL (ref 0–12)

## 2017-04-27 LAB — PROTEIN S, TOTAL: PROTEIN S AG TOTAL: 77 % (ref 60–150)

## 2017-04-27 LAB — PROTEIN C ACTIVITY: Protein C Activity: 97 % (ref 73–180)

## 2017-04-27 LAB — PROTEIN S ACTIVITY: PROTEIN S ACTIVITY: 81 % (ref 63–140)

## 2017-04-28 LAB — LUPUS ANTICOAGULANT PANEL
DRVVT: 47.4 s — ABNORMAL HIGH (ref 0.0–47.0)
PTT Lupus Anticoagulant: 34.8 s (ref 0.0–51.9)

## 2017-04-28 LAB — BETA-2-GLYCOPROTEIN I ABS, IGG/M/A: Beta-2-Glycoprotein I IgM: <9 GPI IgM units (ref 0–32)

## 2017-04-28 LAB — DRVVT MIX: DRVVT MIX: 41.6 s (ref 0.0–47.0)

## 2017-04-29 LAB — PROTHROMBIN GENE MUTATION

## 2017-04-29 LAB — PROTEIN C, TOTAL: Protein C, Total: 89 % (ref 60–150)

## 2017-04-29 NOTE — Progress Notes (Signed)
[facility]  [phone] Fax:([phone]) [phone]  ID: Christopher Carlson OB: 1940/04/14  MR#: [id number]  GMW#:[id number]  Patient Care Team: [place] Pink, MD as PCP - General (Family Medicine)  CHIEF COMPLAINT: Pulmonary embolism/left lower extremity DVT.  INTERVAL HISTORY: Patient is a 77 year old male who had a CT scan of the abdomen for evaluation of hematuria in January 2019 and incidentally noted bilateral pulmonary embolism.  Subsequent workup also included lower extremity ultrasound/Doppler which noted extensive left lower extremity DVT.  He was admitted to the hospital and placed on heparin drip and subsequently switched to Eliquis.  Patient has been on Eliquis since that time and tolerating it well.  He has no neurologic complaints.  He denies any recent fevers or illnesses.  He has a good appetite and denies weight loss.  He has no chest pain or shortness of breath.  He denies any nausea, vomiting, constipation, or diarrhea.  He denies any melena or hematochezia.  He does not complain of further hematuria today.  Patient feels at his baseline offers no specific complaints today.  REVIEW OF SYSTEMS:   Review of Systems  Constitutional: Negative.  Negative for fever, malaise/fatigue and weight loss.  Respiratory: Negative.  Negative for cough, hemoptysis and shortness of breath.   Cardiovascular: Negative.  Negative for chest pain and leg swelling.  Gastrointestinal: Negative.  Negative for abdominal pain, blood in stool and melena.  Genitourinary: Negative.  Negative for hematuria.  Musculoskeletal: Negative.   Skin: Negative.  Negative for rash.  Neurological: Negative.  Negative for sensory change, focal weakness and weakness.  Psychiatric/Behavioral: Negative.  The patient is not nervous/anxious.     As per HPI. Otherwise, a complete review of systems is negative.  PAST MEDICAL HISTORY: Past Medical History:  Diagnosis Date  . BPH (benign prostatic  hyperplasia)   . CAD (coronary artery disease)   . High cholesterol   . Nephrolithiasis   . Thyroid disease     PAST SURGICAL HISTORY: Past Surgical History:  Procedure Laterality Date  . CORONARY STENT INTERVENTION N/A 10/12/2016   Procedure: CORONARY STENT INTERVENTION;  Surgeon: Yolonda Kida, MD;  Location: [facility];  Service: Cardiovascular;  Laterality: N/A;  . HERNIA REPAIR    . LEFT HEART CATH AND CORONARY ANGIOGRAPHY N/A 10/12/2016   Procedure: LEFT HEART CATH AND CORONARY ANGIOGRAPHY and possible pci;  Surgeon: Yolonda Kida, MD;  Location: [facility];  Service: Cardiovascular;  Laterality: N/A;    FAMILY HISTORY: Family History  Problem Relation Age of Onset  . Cancer Father   . Alcohol abuse Father   . Prostate cancer Neg Hx   . Bladder Cancer Neg Hx   . Kidney cancer Neg Hx     ADVANCED DIRECTIVES (Y/N):  N  HEALTH MAINTENANCE: Social History   Tobacco Use  . Smoking status: Former Smoker    Packs/day: 2.50    Years: 20.00    Pack years: 50.00    Types: Cigarettes  . Smokeless tobacco: Never Used  Substance Use Topics  . Alcohol use: No  . Drug use: No     Colonoscopy:  PAP:  Bone density:  Lipid panel:  Allergies  Allergen Reactions  . Ciprofloxacin Rash    Current Outpatient Medications  Medication Sig Dispense Refill  . apixaban (ELIQUIS) 5 MG TABS tablet 2 tablets oral twice a day for six days, then 1 tablet twice a day afterwards 72 tablet 0  . atorvastatin (LIPITOR) 40 MG  tablet Take 40 mg by mouth daily.    . clonazePAM (KLONOPIN) 1 MG tablet Take 0.5 mg by mouth at bedtime.     . clopidogrel (PLAVIX) 75 MG tablet Take by mouth.    . finasteride (PROSCAR) 5 MG tablet Take 5 mg by mouth daily.    Marland Kitchen levothyroxine (SYNTHROID, LEVOTHROID) 112 MCG tablet Take 112 mcg by mouth daily.    . nitroGLYCERIN (NITROSTAT) 0.4 MG SL tablet Place 1 tablet (0.4 mg total) under the tongue every 5 (five) minutes as needed  for chest pain. 30 tablet 0  . pantoprazole (PROTONIX) 40 MG tablet Take 40 mg by mouth daily.    . QUEtiapine (SEROQUEL) 50 MG tablet Take 50 mg by mouth at bedtime.     No current facility-administered medications for this visit.     OBJECTIVE: Vitals:   04/25/17 1541  BP: 125/62  Pulse: 60  Resp: 20  Temp: 97.9 F (36.6 C)     Body mass index is 26.32 kg/m.    ECOG FS:0 - Asymptomatic  General: Well-developed, well-nourished, no acute distress. Eyes: Pink conjunctiva, anicteric sclera. HEENT: Normocephalic, moist mucous membranes, clear oropharnyx. Lungs: Clear to auscultation bilaterally. Heart: Regular rate and rhythm. No rubs, murmurs, or gallops. Abdomen: Soft, nontender, nondistended. No organomegaly noted, normoactive bowel sounds. Musculoskeletal: No edema, cyanosis, or clubbing. Neuro: Alert, answering all questions appropriately. Cranial nerves grossly intact. Skin: No rashes or petechiae noted. Psych: Normal affect. Lymphatics: No cervical, calvicular, axillary or inguinal LAD.   LAB RESULTS:  Lab Results  Component Value Date   NA 137 02/03/2017   K 4.0 02/03/2017   CL 107 02/03/2017   CO2 23 02/03/2017   GLUCOSE 96 02/03/2017   BUN 18 02/03/2017   CREATININE 1.33 (H) 02/03/2017   CALCIUM 8.7 (L) 02/03/2017   PROT 6.8 02/01/2017   ALBUMIN 3.6 02/01/2017   AST 19 02/01/2017   ALT 12 (L) 02/01/2017   ALKPHOS 47 02/01/2017   BILITOT 0.8 02/01/2017   GFRNONAA 50 (L) 02/03/2017   GFRAA 58 (L) 02/03/2017    Lab Results  Component Value Date   WBC 7.5 02/03/2017   NEUTROABS 7.8 (H) 02/01/2017   HGB 14.5 02/03/2017   HCT 43.3 02/03/2017   MCV 92.3 02/03/2017   PLT 200 02/03/2017     STUDIES: No results found.  ASSESSMENT: Pulmonary embolism/left lower extremity DVT.  PLAN:    1. Pulmonary embolism/left lower extremity DVT: Unclear etiology.  Patient appears to have no transient risk factors in January or prior to that as an etiology of his  PE/DVT.  Full hypercoagulable workup completed at this clinic visit was either negative or within normal limits.  He was noted to have a mildly elevated DRVVT which is likely secondary to Eliquis.  Homocystine is only mildly elevated as well.  No intervention is needed at this time.  Have recommended patient complete a total of 6 months of anticoagulation.  Return to clinic in early July for further evaluation and discussion of his hypercoagulable workup.  Approximately 45 minutes was spent in discussion and chart review of which greater than 50% was consultation.  Patient expressed understanding and was in agreement with this plan. He also understands that He can call clinic at any time with any questions, concerns, or complaints.    Lloyd Huger, MD   04/29/2017 8:47 PM

## 2017-05-20 DIAGNOSIS — Z23 Encounter for immunization: Secondary | ICD-10-CM | POA: Diagnosis not present

## 2017-05-20 DIAGNOSIS — Z0000 Encounter for general adult medical examination without abnormal findings: Secondary | ICD-10-CM | POA: Diagnosis not present

## 2017-05-20 DIAGNOSIS — E785 Hyperlipidemia, unspecified: Secondary | ICD-10-CM | POA: Diagnosis not present

## 2017-05-20 DIAGNOSIS — E039 Hypothyroidism, unspecified: Secondary | ICD-10-CM | POA: Diagnosis not present

## 2017-05-20 DIAGNOSIS — R972 Elevated prostate specific antigen [PSA]: Secondary | ICD-10-CM | POA: Diagnosis not present

## 2017-07-13 ENCOUNTER — Emergency Department
Admission: EM | Admit: 2017-07-13 | Discharge: 2017-07-13 | Disposition: A | Discharge status: Home / Self Care | Payer: Medicare HMO | Attending: Student in an Organized Health Care Education/Training Program | Admitting: Student in an Organized Health Care Education/Training Program

## 2017-07-13 ENCOUNTER — Emergency Department: Payer: Medicare HMO

## 2017-07-13 ENCOUNTER — Other Ambulatory Visit: Payer: Self-pay

## 2017-07-13 DIAGNOSIS — R319 Hematuria, unspecified: Secondary | ICD-10-CM | POA: Diagnosis not present

## 2017-07-13 DIAGNOSIS — Z79899 Other long term (current) drug therapy: Secondary | ICD-10-CM | POA: Insufficient documentation

## 2017-07-13 DIAGNOSIS — N200 Calculus of kidney: Secondary | ICD-10-CM | POA: Diagnosis not present

## 2017-07-13 DIAGNOSIS — Z7902 Long term (current) use of antithrombotics/antiplatelets: Secondary | ICD-10-CM | POA: Diagnosis not present

## 2017-07-13 DIAGNOSIS — I2510 Atherosclerotic heart disease of native coronary artery without angina pectoris: Secondary | ICD-10-CM | POA: Diagnosis not present

## 2017-07-13 DIAGNOSIS — Z7901 Long term (current) use of anticoagulants: Secondary | ICD-10-CM

## 2017-07-13 DIAGNOSIS — R310 Gross hematuria: Secondary | ICD-10-CM | POA: Diagnosis not present

## 2017-07-13 DIAGNOSIS — Z87891 Personal history of nicotine dependence: Secondary | ICD-10-CM | POA: Diagnosis not present

## 2017-07-13 LAB — URINALYSIS, COMPLETE (UACMP) WITH MICROSCOPIC
Bacteria, UA: NONE SEEN
Specific Gravity, Urine: 1.01 (ref 1.005–1.030)

## 2017-07-13 LAB — BASIC METABOLIC PANEL
ANION GAP: 8 (ref 5–15)
BUN: 11 mg/dL (ref 6–20)
CHLORIDE: 108 mmol/L (ref 101–111)
CO2: 23 mmol/L (ref 22–32)
Calcium: 8.2 mg/dL — ABNORMAL LOW (ref 8.9–10.3)
Creatinine, Ser: 1.19 mg/dL (ref 0.61–1.24)
GFR calc Af Amer: >60 mL/min (ref >60)
GFR calc non Af Amer: 58 mL/min — ABNORMAL LOW (ref >60)
GLUCOSE: 117 mg/dL — AB (ref 65–99)
Potassium: 3.7 mmol/L (ref 3.5–5.1)
Sodium: 139 mmol/L (ref 135–145)

## 2017-07-13 LAB — CBC WITH DIFFERENTIAL/PLATELET
BASOS ABS: 0.1 10*3/uL (ref 0–0.1)
Basophils Relative: 1 %
Eosinophils Absolute: 0.1 10*3/uL (ref 0–0.7)
Eosinophils Relative: 2 %
HEMATOCRIT: 39 % — AB (ref 40.0–52.0)
Hemoglobin: 13.3 g/dL (ref 13.0–18.0)
LYMPHS PCT: 14 %
Lymphs Abs: 1 10*3/uL (ref 1.0–3.6)
MCH: 30.9 pg (ref 26.0–34.0)
MCHC: 34.1 g/dL (ref 32.0–36.0)
MCV: 90.8 fL (ref 80.0–100.0)
MONO ABS: 0.6 10*3/uL (ref 0.2–1.0)
Monocytes Relative: 8 %
Neutro Abs: 5.3 10*3/uL (ref 1.4–6.5)
Neutrophils Relative %: 75 %
Platelets: 295 10*3/uL (ref 150–440)
RBC: 4.29 MIL/uL — AB (ref 4.40–5.90)
RDW: 13.1 % (ref 11.5–14.5)
WBC: 7.1 10*3/uL (ref 3.8–10.6)

## 2017-07-13 NOTE — ED Provider Notes (Signed)
[facility] Emergency Department Provider Note    First MD Initiated Contact with Patient 07/13/17 1508     (approximate)  I have reviewed the triage vital signs and the nursing notes.   HISTORY  Chief Complaint Hematuria (pt states he had clots in urine this am)    HPI Christopher Carlson is a 77 y.o. male history of nephrolithiasis as well as BPH on Plavix and Eliquis for history of CAD as well as PE presents the ER with gross hematuria.  Patient has had similar episodes of gross hematuria in the past.  Has had to have Foley catheters placed for bladder clots and irrigation.  Denies any nausea vomiting or fevers.  No recent change to his medications.  Patient thinks that he did some heavy lifting yesterday probably precipitated this.  Denies any other chest pain or shortness of breath at this time.    Past Medical History:  Diagnosis Date  . BPH (benign prostatic hyperplasia)   . CAD (coronary artery disease)   . High cholesterol   . Nephrolithiasis   . Thyroid disease    Family History  Problem Relation Age of Onset  . Cancer Father   . Alcohol abuse Father   . Prostate cancer Neg Hx   . Bladder Cancer Neg Hx   . Kidney cancer Neg Hx    Past Surgical History:  Procedure Laterality Date  . CORONARY STENT INTERVENTION N/A 10/12/2016   Procedure: CORONARY STENT INTERVENTION;  Surgeon: Yolonda Kida, MD;  Location: [place] [facility];  Service: Cardiovascular;  Laterality: N/A;  . HERNIA REPAIR    . LEFT HEART CATH AND CORONARY ANGIOGRAPHY N/A 10/12/2016   Procedure: LEFT HEART CATH AND CORONARY ANGIOGRAPHY and possible pci;  Surgeon: Yolonda Kida, MD;  Location: [place] [facility];  Service: Cardiovascular;  Laterality: N/A;   Patient Active Problem List   Diagnosis Date Noted  . Pulmonary embolism ([place]) 02/01/2017  . Syncope 10/10/2016      Prior to Admission medications   Medication Sig Start Date End Date Taking? Authorizing  Provider  apixaban (ELIQUIS) 5 MG TABS tablet 2 tablets oral twice a day for six days, then 1 tablet twice a day afterwards 02/03/17   Loletha Grayer, MD  atorvastatin (LIPITOR) 40 MG tablet Take 40 mg by mouth daily. 07/03/16   [provider]  clonazePAM (KLONOPIN) 1 MG tablet Take 0.5 mg by mouth at bedtime.  09/10/16   [provider]  clopidogrel (PLAVIX) 75 MG tablet Take by mouth. 03/27/17 03/27/18  [provider]  finasteride (PROSCAR) 5 MG tablet Take 5 mg by mouth daily.    [provider]  levothyroxine (SYNTHROID, LEVOTHROID) 112 MCG tablet Take 112 mcg by mouth daily. 07/03/16   [provider]  nitroGLYCERIN (NITROSTAT) 0.4 MG SL tablet Place 1 tablet (0.4 mg total) under the tongue every 5 (five) minutes as needed for chest pain. 10/13/16   Bettey Costa, MD  pantoprazole (PROTONIX) 40 MG tablet Take 40 mg by mouth daily. 07/03/16   [provider]  QUEtiapine (SEROQUEL) 50 MG tablet Take 50 mg by mouth at bedtime. 07/03/16   [provider]    Allergies Ciprofloxacin    Social History Social History   Tobacco Use  . Smoking status: Former Smoker    Packs/day: 2.50    Years: 20.00    Pack years: 50.00    Types: Cigarettes  . Smokeless tobacco: Never Used  Substance Use  Topics  . Alcohol use: No  . Drug use: No    Review of Systems Patient denies headaches, rhinorrhea, blurry vision, numbness, shortness of breath, chest pain, edema, cough, abdominal pain, nausea, vomiting, diarrhea, dysuria, fevers, rashes or hallucinations unless otherwise stated above in HPI. ____________________________________________   PHYSICAL EXAM:  VITAL SIGNS: Vitals:   07/13/17 1444  BP: (!) 154/85  Pulse: 66  Temp: 98 F (36.7 C)  SpO2: 95%    Constitutional: Alert and oriented.  Eyes: Conjunctivae are normal.  Head: Atraumatic. Nose: No congestion/rhinnorhea. Mouth/Throat: Mucous membranes are moist.   Neck: No stridor.  Painless ROM.  Cardiovascular: Normal rate, regular rhythm. Grossly normal heart sounds.  Good peripheral circulation. Respiratory: Normal respiratory effort.  No retractions. Lungs CTAB. Gastrointestinal: Soft and nontender. No distention. No abdominal bruits. No CVA tenderness. Genitourinary: deferred Musculoskeletal: No lower extremity tenderness nor edema.  No joint effusions. Neurologic:  Normal speech and language. No gross focal neurologic deficits are appreciated. No facial droop Skin:  Skin is warm, dry and intact. No rash noted. Psychiatric: Mood and affect are normal. Speech and behavior are normal.  ____________________________________________   LABS (all labs ordered are listed, but only abnormal results are displayed)  Results for orders placed or performed during the hospital encounter of 07/13/17 (from the past 24 hour(s))  CBC with Differential/Platelet     Status: Abnormal   Collection Time: 07/13/17  3:27 PM  Result Value Ref Range   WBC 7.1 3.8 - 10.6 K/uL   RBC 4.29 (L) 4.40 - 5.90 MIL/uL   Hemoglobin 13.3 13.0 - 18.0 g/dL   HCT 39.0 (L) 40.0 - 52.0 %   MCV 90.8 80.0 - 100.0 fL   MCH 30.9 26.0 - 34.0 pg   MCHC 34.1 32.0 - 36.0 g/dL   RDW 13.1 11.5 - 14.5 %   Platelets 295 150 - 440 K/uL   Neutrophils Relative % 75 %   Neutro Abs 5.3 1.4 - 6.5 K/uL   Lymphocytes Relative 14 %   Lymphs Abs 1.0 1.0 - 3.6 K/uL   Monocytes Relative 8 %   Monocytes Absolute 0.6 0.2 - 1.0 K/uL   Eosinophils Relative 2 %   Eosinophils Absolute 0.1 0 - 0.7 K/uL   Basophils Relative 1 %   Basophils Absolute 0.1 0 - 0.1 K/uL   ____________________________________________   ____________________________________________  RADIOLOGY  I personally reviewed all radiographic images ordered to evaluate for the above acute complaints and reviewed radiology reports and findings.  These findings were personally discussed with the patient.  Please see medical record for radiology  report.  ____________________________________________   PROCEDURES  Procedure(s) performed:  Procedures    Critical Care performed: no ____________________________________________   INITIAL IMPRESSION / ASSESSMENT AND PLAN / ED COURSE  Pertinent labs & imaging results that were available during my care of the patient were reviewed by me and considered in my medical decision making (see chart for details).   DDX: Stone mass, BPH, anticoagulation complication, UTI  Christopher Carlson is a 77 y.o. who presents to the ED with symptoms as described above.  Patient well-appearing.  No tachycardia or hypotension.  Does have evidence of gross hematuria but no evidence of obstruction on exam.  Will check blood work for the above differential.  Will order CT renal stone as he does have a history of stones and BPH to further rule out any other abnormality.  CT stone shows enlarged bph, no obstructive stone.  Clinical Course as of  Jul 13 1657  Sat Jul 13, 2017  1638 Ultrasound of bladder shows no evidence of clot.  No evidence of bacteria on urine.  Patient is able to empty his bladder.  He is currently on Proscar.   [PR]  5573 This point as he is freely emptying his bladder with no obstructive pathology at this point do believe he stable and appropriate for outpatient follow-up.  Have encouraged oral hydration and follow-up with urology.   [PR]    Clinical Course User Index [PR] Merlyn Lot, MD     As part of my medical decision making, I reviewed the following data within the [facility] notes reviewed and incorporated, Labs reviewed, notes from prior ED visits.   ____________________________________________   FINAL CLINICAL IMPRESSION(S) / ED DIAGNOSES  Final diagnoses:  Gross hematuria  Anticoagulated      NEW MEDICATIONS STARTED DURING THIS VISIT:  New Prescriptions   No medications on file     Note:  This document was prepared using Dragon  voice recognition software and may include unintentional dictation errors.    Merlyn Lot, MD 07/13/17 1700

## 2017-07-13 NOTE — ED Notes (Signed)
Patient denies pain and is resting comfortably.  

## 2017-08-04 NOTE — Progress Notes (Signed)
[facility]  [phone]) [phone] Fax:([phone]) [phone]  ID: Christopher Carlson OB: 1940-12-28  MR#: [id number]  [facility]#:[id number]  Patient Care Team: [place] Pink, MD as PCP - General (Family Medicine)  CHIEF COMPLAINT: Pulmonary embolism/left lower extremity DVT.  INTERVAL HISTORY: Patient returns to clinic today for further evaluation and discussion on whether or not to continue Eliquis.  He continues to feel well and remains asymptomatic.  He denies any easy bleeding or bruising.  He has no neurologic complaints.  He denies any recent fevers or illnesses.  He has a good appetite and denies weight loss.  He has no chest pain or shortness of breath.  He denies any nausea, vomiting, constipation, or diarrhea.  He denies any melena or hematochezia.  He has no urinary complaints.  Patient feels at his baseline offers no specific complaints today.    REVIEW OF SYSTEMS:   Review of Systems  Constitutional: Negative.  Negative for fever, malaise/fatigue and weight loss.  Respiratory: Negative.  Negative for cough, hemoptysis and shortness of breath.   Cardiovascular: Negative.  Negative for chest pain and leg swelling.  Gastrointestinal: Negative.  Negative for abdominal pain, blood in stool and melena.  Genitourinary: Negative.  Negative for hematuria.  Musculoskeletal: Negative.  Negative for back pain.  Skin: Negative.  Negative for rash.  Neurological: Negative.  Negative for sensory change, focal weakness and weakness.  Endo/Heme/Allergies: Does not bruise/bleed easily.  Psychiatric/Behavioral: Negative.  The patient is not nervous/anxious.     As per HPI. Otherwise, a complete review of systems is negative.  PAST MEDICAL HISTORY: Past Medical History:  Diagnosis Date  . BPH (benign prostatic hyperplasia)   . CAD (coronary artery disease)   . High cholesterol   . Nephrolithiasis   . Thyroid disease     PAST SURGICAL HISTORY: Past Surgical History:  Procedure  Laterality Date  . CORONARY STENT INTERVENTION N/A 10/12/2016   Procedure: CORONARY STENT INTERVENTION;  Surgeon: Yolonda Kida, MD;  Location: [place] [facility];  Service: Cardiovascular;  Laterality: N/A;  . HERNIA REPAIR    . LEFT HEART CATH AND CORONARY ANGIOGRAPHY N/A 10/12/2016   Procedure: LEFT HEART CATH AND CORONARY ANGIOGRAPHY and possible pci;  Surgeon: Yolonda Kida, MD;  Location: [place] [facility];  Service: Cardiovascular;  Laterality: N/A;    FAMILY HISTORY: Family History  Problem Relation Age of Onset  . Cancer Father   . Alcohol abuse Father   . Prostate cancer Neg Hx   . Bladder Cancer Neg Hx   . Kidney cancer Neg Hx     ADVANCED DIRECTIVES (Y/N):  N  HEALTH MAINTENANCE: Social History   Tobacco Use  . Smoking status: Former Smoker    Packs/day: 2.50    Years: 20.00    Pack years: 50.00    Types: Cigarettes  . Smokeless tobacco: Never Used  Substance Use Topics  . Alcohol use: No  . Drug use: No     Colonoscopy:  PAP:  Bone density:  Lipid panel:  Allergies  Allergen Reactions  . Ciprofloxacin Rash    Current Outpatient Medications  Medication Sig Dispense Refill  . apixaban (ELIQUIS) 5 MG TABS tablet 2 tablets oral twice a day for six days, then 1 tablet twice a day afterwards 72 tablet 0  . atorvastatin (LIPITOR) 40 MG tablet Take 40 mg by mouth daily.    . clonazePAM (KLONOPIN) 1 MG tablet Take 0.5 mg by mouth at bedtime.     Marland Kitchen  clopidogrel (PLAVIX) 75 MG tablet Take by mouth daily.     . finasteride (PROSCAR) 5 MG tablet Take 5 mg by mouth daily.    Marland Kitchen levothyroxine (SYNTHROID, LEVOTHROID) 112 MCG tablet Take 112 mcg by mouth daily.    . pantoprazole (PROTONIX) 40 MG tablet Take 40 mg by mouth daily.    . QUEtiapine (SEROQUEL) 50 MG tablet Take 50 mg by mouth at bedtime.    . nitroGLYCERIN (NITROSTAT) 0.4 MG SL tablet Place 1 tablet (0.4 mg total) under the tongue every 5 (five) minutes as needed for chest pain. (Patient not  taking: Reported on 08/05/2017) 30 tablet 0   No current facility-administered medications for this visit.     OBJECTIVE: Vitals:   08/05/17 1446  BP: 132/73  Pulse: 65  Resp: 18  Temp: (!) 96.1 F (35.6 C)     Body mass index is 25.12 kg/m.    ECOG FS:0 - Asymptomatic  General: Well-developed, well-nourished, no acute distress. Eyes: Pink conjunctiva, anicteric sclera. Lungs: Clear to auscultation bilaterally. Heart: Regular rate and rhythm. No rubs, murmurs, or gallops. Abdomen: Soft, nontender, nondistended. No organomegaly noted, normoactive bowel sounds. Musculoskeletal: No edema, cyanosis, or clubbing. Neuro: Alert, answering all questions appropriately. Cranial nerves grossly intact. Skin: No rashes or petechiae noted. Psych: Normal affect.  LAB RESULTS:  Lab Results  Component Value Date   NA 139 07/13/2017   K 3.7 07/13/2017   CL 108 07/13/2017   CO2 23 07/13/2017   GLUCOSE 117 (H) 07/13/2017   BUN 11 07/13/2017   CREATININE 1.19 07/13/2017   CALCIUM 8.2 (L) 07/13/2017   PROT 6.8 02/01/2017   ALBUMIN 3.6 02/01/2017   AST 19 02/01/2017   ALT 12 (L) 02/01/2017   ALKPHOS 47 02/01/2017   BILITOT 0.8 02/01/2017   GFRNONAA 58 (L) 07/13/2017   GFRAA >60 07/13/2017    Lab Results  Component Value Date   WBC 7.1 07/13/2017   NEUTROABS 5.3 07/13/2017   HGB 13.3 07/13/2017   HCT 39.0 (L) 07/13/2017   MCV 90.8 07/13/2017   PLT 295 07/13/2017     STUDIES: Ct Renal Stone Study  Result Date: 07/13/2017 CLINICAL DATA:  Acute hematuria with clots today. Patient on anticoagulation medicine. EXAM: CT ABDOMEN AND PELVIS WITHOUT CONTRAST TECHNIQUE: Multidetector CT imaging of the abdomen and pelvis was performed following the standard protocol without IV contrast. COMPARISON:  CT 02/01/2017 FINDINGS: Lower chest: Lung bases are clear. Hepatobiliary: No focal hepatic lesion. No biliary duct dilatation. Gallbladder is normal. Common bile duct is normal. Pancreas:  Pancreas is normal. No ductal dilatation. No pancreatic inflammation. Spleen: Normal spleen Adrenals/urinary tract: Adrenal glands normal. 3 mm nonobstructing calculus in lower pole of the RIGHT kidney. 1 mm nonobstructing calculus lower pole LEFT kidney. No ureterolithiasis. No bladder calculi. bladder is elevated by a markedly enlarged prostate gland measuring normal 6.9 by 6.1 x 7.3 cm (volume = 160 cm^3). Stomach/Bowel: A stomach, small-bowel appendix cecum normal. The colon and rectosigmoid colon normal. There multiple diverticular of the the the sigmoid colon without evidence of acute inflammation. Vascular/Lymphatic: Abdominal aorta is normal caliber with atherosclerotic calcification. There is no retroperitoneal or periportal lymphadenopathy. No pelvic lymphadenopathy. Reproductive: Prostate gland is markedly enlarged with a calculated volume of 160 cc Other: No free fluid. Musculoskeletal: A IMPRESSION: 1. Bilateral small nonobstructing renal calculi. 2. No ureterolithiasis or obstructive uropathy. 3. A markedly enlarged prostate gland elevates the bladder. 4. Sigmoid diverticulosis without evidence diverticulitis. Electronically Signed   By: Nicole Kindred  Leonia Reeves M.D.   On: 07/13/2017 16:38    ASSESSMENT: Pulmonary embolism/left lower extremity DVT.  PLAN:    1. Pulmonary embolism/left lower extremity DVT: Incidental finding and unclear etiology.  Patient did not appear to have any transient risk factors in January or prior to that as an etiology of his PE/DVT.  Full hypercoagulable workup was either negative or within normal limits.  He was noted to have a mildly elevated DRVVT which was likely secondary to taking Eliquis at the time of the lab draw.  Homocystine is only mildly elevated as well, but this is likely clinically insignificant.  After lengthy discussion with the patient, I t was agreed upon that no further treatment with Eliquis is necessary.  He has been instructed to complete his current  prescription and then discontinue treatment.  Patient expressed understanding that if he had a second DVT or PE for whatever reason, at this point would likely recommend lifelong anticoagulation.  No further interventions are needed.  No follow-up is necessary. 2.  Cardiac disease: Continue Plavix per cardiology.  Continue follow-up with cardiology as scheduled.  I spent a total of 20 minutes face-to-face with the patient of which greater than 50% of the visit was spent in counseling and coordination of care as detailed above.  Patient expressed understanding and was in agreement with this plan. He also understands that He can call clinic at any time with any questions, concerns, or complaints.    Lloyd Huger, MD   08/05/2017 3:56 PM

## 2017-08-05 ENCOUNTER — Inpatient Hospital Stay: Payer: Medicare HMO | Attending: Oncology | Admitting: Oncology

## 2017-08-05 ENCOUNTER — Other Ambulatory Visit: Payer: Self-pay

## 2017-08-05 VITALS — BP 132/73 | HR 65 | Temp 96.1°F | Resp 18 | Wt 185.2 lb

## 2017-08-05 DIAGNOSIS — I519 Heart disease, unspecified: Secondary | ICD-10-CM

## 2017-08-05 DIAGNOSIS — Z7901 Long term (current) use of anticoagulants: Secondary | ICD-10-CM | POA: Diagnosis not present

## 2017-08-05 DIAGNOSIS — I2782 Chronic pulmonary embolism: Secondary | ICD-10-CM | POA: Diagnosis not present

## 2017-08-05 NOTE — Progress Notes (Signed)
Here for follow up. Per pt " doing fine" working up to 1 h per day -doing well.

## 2017-08-07 ENCOUNTER — Ambulatory Visit: Payer: Medicare HMO | Admitting: Oncology

## 2017-08-13 DIAGNOSIS — G4733 Obstructive sleep apnea (adult) (pediatric): Secondary | ICD-10-CM | POA: Diagnosis not present

## 2017-08-13 DIAGNOSIS — I2510 Atherosclerotic heart disease of native coronary artery without angina pectoris: Secondary | ICD-10-CM | POA: Diagnosis not present

## 2017-08-13 DIAGNOSIS — R55 Syncope and collapse: Secondary | ICD-10-CM | POA: Diagnosis not present

## 2017-08-13 DIAGNOSIS — Z95820 Peripheral vascular angioplasty status with implants and grafts: Secondary | ICD-10-CM | POA: Diagnosis not present

## 2017-08-13 DIAGNOSIS — E782 Mixed hyperlipidemia: Secondary | ICD-10-CM | POA: Diagnosis not present

## 2017-08-13 DIAGNOSIS — I10 Essential (primary) hypertension: Secondary | ICD-10-CM | POA: Diagnosis not present

## 2017-08-13 DIAGNOSIS — I214 Non-ST elevation (NSTEMI) myocardial infarction: Secondary | ICD-10-CM | POA: Diagnosis not present

## 2017-08-13 DIAGNOSIS — G4700 Insomnia, unspecified: Secondary | ICD-10-CM | POA: Diagnosis not present

## 2017-08-13 DIAGNOSIS — K219 Gastro-esophageal reflux disease without esophagitis: Secondary | ICD-10-CM | POA: Diagnosis not present

## 2017-08-16 ENCOUNTER — Ambulatory Visit: Payer: Medicare HMO | Admitting: Urology

## 2017-09-27 ENCOUNTER — Ambulatory Visit: Payer: Medicare HMO | Admitting: Urology

## 2017-10-18 ENCOUNTER — Ambulatory Visit (INDEPENDENT_AMBULATORY_CARE_PROVIDER_SITE_OTHER): Payer: Medicare HMO | Admitting: Urology

## 2017-10-18 ENCOUNTER — Encounter: Payer: Self-pay | Admitting: Urology

## 2017-10-18 ENCOUNTER — Other Ambulatory Visit
Admission: RE | Admit: 2017-10-18 | Discharge: 2017-10-18 | Disposition: A | Discharge status: Home / Self Care | Payer: Medicare HMO | Source: Ambulatory Visit | Attending: Urology | Admitting: Urology

## 2017-10-18 VITALS — BP 135/69 | HR 73 | Ht 72.0 in | Wt 185.0 lb

## 2017-10-18 DIAGNOSIS — N200 Calculus of kidney: Secondary | ICD-10-CM | POA: Diagnosis not present

## 2017-10-18 DIAGNOSIS — Z87898 Personal history of other specified conditions: Secondary | ICD-10-CM

## 2017-10-18 DIAGNOSIS — N138 Other obstructive and reflux uropathy: Secondary | ICD-10-CM

## 2017-10-18 DIAGNOSIS — N401 Enlarged prostate with lower urinary tract symptoms: Secondary | ICD-10-CM | POA: Diagnosis not present

## 2017-10-18 DIAGNOSIS — R972 Elevated prostate specific antigen [PSA]: Secondary | ICD-10-CM

## 2017-10-18 DIAGNOSIS — N99111 Postprocedural bulbous urethral stricture: Secondary | ICD-10-CM | POA: Diagnosis not present

## 2017-10-18 DIAGNOSIS — Z87448 Personal history of other diseases of urinary system: Secondary | ICD-10-CM

## 2017-10-18 LAB — URINALYSIS, COMPLETE (UACMP) WITH MICROSCOPIC
BILIRUBIN URINE: NEGATIVE
Bacteria, UA: NONE SEEN
Glucose, UA: NEGATIVE mg/dL
KETONES UR: NEGATIVE mg/dL
Nitrite: NEGATIVE
PROTEIN: NEGATIVE mg/dL
RBC / HPF: NONE SEEN RBC/hpf (ref 0–5)
SQUAMOUS EPITHELIAL / LPF: NONE SEEN (ref 0–5)
Specific Gravity, Urine: <1.005 — ABNORMAL LOW (ref 1.005–1.030)
pH: 5.5 (ref 5.0–8.0)

## 2017-10-18 NOTE — Progress Notes (Signed)
10/18/2017 4:15 PM   Christopher Carlson April 13, 1940 [id number]  Referring provider: Maryland Pink, MD [address] [place], [place] [address]  Chief Complaint  Patient presents with  . Benign Prostatic Hypertrophy    5 month    HPI: 77 year old male who returns today for routine follow-up.  He initially was seen and evaluated for gross hematuria and underwent CT urogram in September 2018 diagnosed with a massive saddle embolus incidentally.  After he recovered, he presented in 03/2017 for cystoscopy found to have a 8 French bulbar urethral stricture in the setting of multiple medical comorbidities and need for anticoagulation, no further intervention was pursued at the time.  He returns today for routine follow-up.  He is remained asymptomatic.  He has not had any recurrent episodes of gross hematuria.  He denies any baseline urinary symptoms including urgency, frequency, incomplete bladder emptying amongst others.  He was previously followed by a urologist in [place], records were previously requested but never received.  A new records request was sent today.  His most recent PSA was 5.84 on 02/19/2017.  This is down significantly from his previous baseline of around 12 per his report, as high as 14.  He has had 4- prostate biopsies in the past.  CT scan prostate volume estimated around 160 cc.  He was previously followed by a urologist in [place].  He is chronically on finasteride.    In addition to the above, he does have bilateral kidney stones measuring up to 4 mm.  No recent flank pain.  PMH: Past Medical History:  Diagnosis Date  . BPH (benign prostatic hyperplasia)   . CAD (coronary artery disease)   . High cholesterol   . Nephrolithiasis   . Thyroid disease     Surgical History: Past Surgical History:  Procedure Laterality Date  . CORONARY STENT INTERVENTION N/A 10/12/2016   Procedure: CORONARY STENT INTERVENTION;  Surgeon: Yolonda Kida, MD;   Location: [facility];  Service: Cardiovascular;  Laterality: N/A;  . HERNIA REPAIR    . LEFT HEART CATH AND CORONARY ANGIOGRAPHY N/A 10/12/2016   Procedure: LEFT HEART CATH AND CORONARY ANGIOGRAPHY and possible pci;  Surgeon: Yolonda Kida, MD;  Location: [facility];  Service: Cardiovascular;  Laterality: N/A;    Home Medications:  Allergies as of 10/18/2017      Reactions   Ciprofloxacin Rash      Medication List        Accurate as of 10/18/17 11:59 PM. Always use your most recent med list.          apixaban 5 MG Tabs tablet Commonly known as:  ELIQUIS 2 tablets oral twice a day for six days, then 1 tablet twice a day afterwards   atorvastatin 40 MG tablet Commonly known as:  LIPITOR Take 40 mg by mouth daily.   clonazePAM 1 MG tablet Commonly known as:  KLONOPIN Take 0.5 mg by mouth at bedtime.   clopidogrel 75 MG tablet Commonly known as:  PLAVIX Take by mouth daily.   finasteride 5 MG tablet Commonly known as:  PROSCAR Take 5 mg by mouth daily.   levothyroxine 112 MCG tablet Commonly known as:  SYNTHROID, LEVOTHROID Take 112 mcg by mouth daily.   nitroGLYCERIN 0.4 MG SL tablet Commonly known as:  NITROSTAT Place 1 tablet (0.4 mg total) under the tongue every 5 (five) minutes as needed for chest pain.   pantoprazole 40 MG tablet Commonly known as:  PROTONIX Take 40 mg by mouth daily.   QUEtiapine 50 MG tablet Commonly known as:  SEROQUEL Take 50 mg by mouth at bedtime.       Allergies:  Allergies  Allergen Reactions  . Ciprofloxacin Rash    Family History: Family History  Problem Relation Age of Onset  . Cancer Father   . Alcohol abuse Father   . Prostate cancer Neg Hx   . Bladder Cancer Neg Hx   . Kidney cancer Neg Hx     Social History:  reports that he has quit smoking. His smoking use included cigarettes. He has a 50.00 pack-year smoking history. He has never used smokeless tobacco. He reports that he does not  drink alcohol or use drugs.  ROS: UROLOGY Frequent Urination?: No Hard to postpone urination?: No Burning/pain with urination?: No Get up at night to urinate?: No Leakage of urine?: No Urine stream starts and stops?: No Trouble starting stream?: No Do you have to strain to urinate?: No Blood in urine?: No Urinary tract infection?: No Sexually transmitted disease?: No Injury to kidneys or bladder?: No Painful intercourse?: No Weak stream?: No Erection problems?: No Penile pain?: No  Gastrointestinal Nausea?: No Vomiting?: No Indigestion/heartburn?: No Diarrhea?: No Constipation?: No  Constitutional Fever: No Night sweats?: No Weight loss?: No Fatigue?: No  Skin Skin rash/lesions?: No Itching?: No  Eyes Blurred vision?: No Double vision?: No  Ears/Nose/Throat Sore throat?: No Sinus problems?: No  Hematologic/Lymphatic Swollen glands?: No Easy bruising?: Yes  Cardiovascular Leg swelling?: No Chest pain?: No  Respiratory Cough?: No Shortness of breath?: No  Endocrine Excessive thirst?: No  Musculoskeletal Back pain?: No Joint pain?: No  Neurological Headaches?: No Dizziness?: No  Psychologic Depression?: No Anxiety?: No  Physical Exam: BP 135/69   Pulse 73   Ht 6' (1.829 m)   Wt 185 lb (83.9 kg)   BMI 25.09 kg/m   Constitutional:  Alert and oriented, No acute distress. HEENT: [place] AT, moist mucus membranes.  Trachea midline, no masses. Cardiovascular: No clubbing, cyanosis, or edema. Respiratory: Normal respiratory effort, no increased work of breathing. Skin: No rashes, bruises or suspicious lesions. Neurologic: Grossly intact, no focal deficits, moving all 4 extremities. Psychiatric: Normal mood and affect.  Laboratory Data: Lab Results  Component Value Date   WBC 7.1 07/13/2017   HGB 13.3 07/13/2017   HCT 39.0 (L) 07/13/2017   MCV 90.8 07/13/2017   PLT 295 07/13/2017    Lab Results  Component Value Date   CREATININE 1.19  07/13/2017   PSA 5.83 on 01/2017  Urinalysis Component     Latest Ref Rng & Units 10/18/2017  Color, Urine     YELLOW STRAW (A)  Appearance     CLEAR CLEAR  Specific Gravity, Urine     1.005 - 1.030 <1.005 (L)  pH     5.0 - 8.0 5.5  Glucose, UA     NEGATIVE mg/dL NEGATIVE  Hgb urine dipstick     NEGATIVE TRACE (A)  Bilirubin Urine     NEGATIVE NEGATIVE  Ketones, ur     NEGATIVE mg/dL NEGATIVE  Protein     NEGATIVE mg/dL NEGATIVE  Nitrite     NEGATIVE NEGATIVE  Leukocytes, UA     NEGATIVE TRACE (A)  RBC / HPF     0 - 5 RBC/hpf NONE SEEN  WBC, UA     0 - 5 WBC/hpf 0-5  Bacteria, UA     NONE SEEN NONE SEEN  Squamous Epithelial /  LPF     0 - 5 NONE SEEN   Pertinent Imaging: n/a  Assessment & Plan:    1. Benign prostatic hyperplasia with urinary obstruction Massive BPH 160 cc on finasteride Relatively asymptomatic Continue finasteride  2. Kidney stones Asymptomatic small bilateral kidney stones We will monitor with KUB in 6 months Reviewed stone diet recommendations - DG Abd 1 View; Future  3. Postprocedural bulbous urethral stricture 8 French bulbar urethral stricture, asymptomatic Unable to pass scope without dilation Patient is not interested in pursuing any intervention for this currently given his lack of symptoms  4. History of gross hematuria Multiple risk factors for gross hematuria including massive prostamegaly, urethral stricture chronic anticoagulation/antiplatelet therapy, and history of stones Bladder never evaluated due to inability to pass scope, patient not interested in pursuing any ureteral dilation We discussed the risk of possible undiagnosed/untreated bladder malignancy although this is less likely given his multiple other additional causes He had no recurrent episodes of gross blood UA today shows no microscopic blood was also reassuring We will continue to for this cystoscopy at this time, risk and benefits were reviewed again today in  detail -- Urinalysis, Complete w Microscopic; Future - DG Abd 1 View; Future  5. Elevated PSA Records requested again today PSA is actually downward trending from his baseline which is reassuring We will continue to follow given that he is on finasteride   Return for KUB in 6 months, records release.  Hollice Espy, MD  [facility] [address] [place], [place] [address] [phone]  I spent 25 min with this patient of which greater than 50% was spent in counseling and coordination of care with the patient.

## 2017-10-20 LAB — URINE CULTURE: Culture: NO GROWTH

## 2017-11-19 DIAGNOSIS — F329 Major depressive disorder, single episode, unspecified: Secondary | ICD-10-CM | POA: Diagnosis not present

## 2017-11-19 DIAGNOSIS — I25119 Atherosclerotic heart disease of native coronary artery with unspecified angina pectoris: Secondary | ICD-10-CM | POA: Diagnosis not present

## 2017-11-19 DIAGNOSIS — E039 Hypothyroidism, unspecified: Secondary | ICD-10-CM | POA: Diagnosis not present

## 2017-11-19 DIAGNOSIS — E785 Hyperlipidemia, unspecified: Secondary | ICD-10-CM | POA: Diagnosis not present

## 2017-11-19 DIAGNOSIS — N400 Enlarged prostate without lower urinary tract symptoms: Secondary | ICD-10-CM | POA: Diagnosis not present

## 2017-11-19 DIAGNOSIS — Z23 Encounter for immunization: Secondary | ICD-10-CM | POA: Diagnosis not present

## 2017-11-19 DIAGNOSIS — K219 Gastro-esophageal reflux disease without esophagitis: Secondary | ICD-10-CM | POA: Diagnosis not present

## 2017-11-19 DIAGNOSIS — G4700 Insomnia, unspecified: Secondary | ICD-10-CM | POA: Diagnosis not present

## 2017-11-19 DIAGNOSIS — F419 Anxiety disorder, unspecified: Secondary | ICD-10-CM | POA: Diagnosis not present

## 2017-12-03 ENCOUNTER — Ambulatory Visit: Payer: Medicare HMO | Admitting: Urology

## 2017-12-03 ENCOUNTER — Encounter: Payer: Self-pay | Admitting: Urology

## 2017-12-03 ENCOUNTER — Other Ambulatory Visit: Payer: Self-pay | Admitting: Radiology

## 2017-12-03 ENCOUNTER — Telehealth: Payer: Self-pay | Admitting: Radiology

## 2017-12-03 VITALS — BP 117/62 | HR 98 | Ht 72.0 in | Wt 185.0 lb

## 2017-12-03 DIAGNOSIS — N138 Other obstructive and reflux uropathy: Secondary | ICD-10-CM

## 2017-12-03 DIAGNOSIS — N99111 Postprocedural bulbous urethral stricture: Secondary | ICD-10-CM | POA: Diagnosis not present

## 2017-12-03 DIAGNOSIS — N401 Enlarged prostate with lower urinary tract symptoms: Secondary | ICD-10-CM | POA: Diagnosis not present

## 2017-12-03 DIAGNOSIS — R310 Gross hematuria: Secondary | ICD-10-CM | POA: Diagnosis not present

## 2017-12-03 LAB — MICROSCOPIC EXAMINATION

## 2017-12-03 LAB — URINALYSIS, COMPLETE
Bilirubin, UA: NEGATIVE
Glucose, UA: NEGATIVE
KETONES UA: NEGATIVE
NITRITE UA: NEGATIVE
PH UA: 5.5 (ref 5.0–7.5)
Urobilinogen, Ur: 0.2 mg/dL (ref 0.2–1.0)

## 2017-12-03 NOTE — Progress Notes (Signed)
12/03/2017 1:22 PM   Christopher Carlson December 18, 1940 [id number]  Referring provider: Maryland Pink, MD [address] [place], [place] [address]  Chief Complaint  Patient presents with  . Hematuria    HPI: 77 yo M with personal history of gross hematuria who returns today with recurrent ongoing episode.  He reports that about a week ago, he developed severe gross hematuria which was initially dark with a few clots.  The clots are subsiding but he still has persistent gross hematuria.  He denies any dysuria, bladder pain, or any other associated symptoms.  His voiding is at baseline.  Notably, he initially was seen and evaluated for gross hematuria and underwent CT urogram in September 2018 diagnosed with a massive saddle embolus incidentally.  After he recovered, he presented in 03/2017 for cystoscopy found to have a 8 French bulbar urethral stricture in the setting of multiple medical comorbidities and need for anticoagulation, no further intervention was pursued at the time.  He was last seen in our office on 09/2017 at which time he remained asymptomatic without recurrent episodes of gross hematuria.  At the time, his UA was completely negative without red blood cells.  He elected to not pursue any further intervention as this would require urethral dilation in the operating room.  He also has a history of elevated PSA, status post negative biopsies in the past.  Prostamegaly with estimated volume of 160 cc.  He is chronically on finasteride.  He was previously followed by a urologist in [place] for this.  Cardiologist [place]- currently on ASA / plavix s/p cardiac stent 1 year ago.  PMH: Past Medical History:  Diagnosis Date  . BPH (benign prostatic hyperplasia)   . CAD (coronary artery disease)   . High cholesterol   . Nephrolithiasis   . Thyroid disease     Surgical History: Past Surgical History:  Procedure Laterality Date  . CORONARY STENT INTERVENTION  N/A 10/12/2016   Procedure: CORONARY STENT INTERVENTION;  Surgeon: Yolonda Kida, MD;  Location: [facility];  Service: Cardiovascular;  Laterality: N/A;  . HERNIA REPAIR    . LEFT HEART CATH AND CORONARY ANGIOGRAPHY N/A 10/12/2016   Procedure: LEFT HEART CATH AND CORONARY ANGIOGRAPHY and possible pci;  Surgeon: Yolonda Kida, MD;  Location: [facility];  Service: Cardiovascular;  Laterality: N/A;    Home Medications:  Allergies as of 12/03/2017      Reactions   Ciprofloxacin Rash      Medication List        Accurate as of 12/03/17  1:22 PM. Always use your most recent med list.          aspirin EC 81 MG tablet Take 81 mg by mouth daily.   atorvastatin 40 MG tablet Commonly known as:  LIPITOR Take 40 mg by mouth daily.   clonazePAM 1 MG tablet Commonly known as:  KLONOPIN Take 0.5 mg by mouth at bedtime.   clopidogrel 75 MG tablet Commonly known as:  PLAVIX Take by mouth daily.   finasteride 5 MG tablet Commonly known as:  PROSCAR Take 5 mg by mouth daily.   levothyroxine 112 MCG tablet Commonly known as:  SYNTHROID, LEVOTHROID Take 112 mcg by mouth daily.   nitroGLYCERIN 0.4 MG SL tablet Commonly known as:  NITROSTAT Place 1 tablet (0.4 mg total) under the tongue every 5 (five) minutes as needed for chest pain.   pantoprazole 40 MG tablet Commonly known as:  PROTONIX  Take 40 mg by mouth daily.   QUEtiapine 50 MG tablet Commonly known as:  SEROQUEL Take 50 mg by mouth at bedtime.       Allergies:  Allergies  Allergen Reactions  . Ciprofloxacin Rash    Family History: Family History  Problem Relation Age of Onset  . Cancer Father   . Alcohol abuse Father   . Prostate cancer Neg Hx   . Bladder Cancer Neg Hx   . Kidney cancer Neg Hx     Social History:  reports that he has quit smoking. His smoking use included cigarettes. He has a 50.00 pack-year smoking history. He has never used smokeless tobacco. He reports that he  does not drink alcohol or use drugs.  ROS: UROLOGY Frequent Urination?: No Hard to postpone urination?: No Burning/pain with urination?: No Get up at night to urinate?: Yes Leakage of urine?: No Urine stream starts and stops?: Yes Trouble starting stream?: No Do you have to strain to urinate?: No Blood in urine?: Yes Urinary tract infection?: No Sexually transmitted disease?: No Injury to kidneys or bladder?: No Painful intercourse?: No Weak stream?: No Erection problems?: No Penile pain?: No  Gastrointestinal Nausea?: No Vomiting?: No Indigestion/heartburn?: No Diarrhea?: No Constipation?: No  Constitutional Fever: No Night sweats?: No Weight loss?: No Fatigue?: No  Skin Skin rash/lesions?: No Itching?: No  Eyes Blurred vision?: No Double vision?: No  Ears/Nose/Throat Sore throat?: No Sinus problems?: No  Hematologic/Lymphatic Swollen glands?: No Easy bruising?: No  Cardiovascular Leg swelling?: No Chest pain?: No  Respiratory Cough?: No Shortness of breath?: No  Endocrine Excessive thirst?: No  Musculoskeletal Back pain?: No Joint pain?: No  Neurological Headaches?: No Dizziness?: No  Psychologic Depression?: No Anxiety?: No  Physical Exam: BP 117/62   Pulse 98   Ht 6' (1.829 m)   Wt 185 lb (83.9 kg)   BMI 25.09 kg/m   Constitutional:  Alert and oriented, No acute distress. HEENT: [place] AT, moist mucus membranes.  Trachea midline, no masses. Cardiovascular: No clubbing, cyanosis, or edema. Respiratory: Normal respiratory effort, no increased work of breathing. Skin: No rashes, bruises or suspicious lesions. Neurologic: Grossly intact, no focal deficits, moving all 4 extremities. Psychiatric: Normal mood and affect.  Laboratory Data: Lab Results  Component Value Date   WBC 7.1 07/13/2017   HGB 13.3 07/13/2017   HCT 39.0 (L) 07/13/2017   MCV 90.8 07/13/2017   PLT 295 07/13/2017    Lab Results  Component Value Date    CREATININE 1.19 07/13/2017    Urinalysis UA personally reviewed, there is greater than 30 red blood cells without concern for infection.  Pertinent Imaging: No new interval imaging  Assessment & Plan:    1. Gross hematuria Recurrent episodes of gross hematuria  Given that he has had yet another episode, I strongly recommended that we in fact do proceed to the operating room for urethral dilation, cystoscopy, bilateral retrograde pyelogram and possible intervention is deemed necessary.  We discussed the differential diagnosis for gross hematuria, most likely secondary to ambulate the platelet therapy in the setting of prostamegaly, however, other underlying etiologies cannot be ruled out without direct visualization.  Risk and benefits of surgery were discussed in detail with the patient including risk of bleeding, damage surrounding structures, need for postoperative Foley catheter for approximately 72 hours, injury to urethra or bladder amongst others.  All questions were answered.  Preop urine culture  He will need clearance from his cardiologist, Dr. Clayborn Bigness to hold his Plavix.  He can likely continue his aspirin for this procedure.  - Urinalysis, Complete - CULTURE, URINE COMPREHENSIVE  2. Postprocedural bulbous urethral stricture Unable to visualize bladder in the office secondary to bulbar urethral stricture, relatively asymptomatic from this  3. Benign prostatic hyperplasia with urinary obstruction Massive prostamegaly chronically on finasteride Possibly the source of gross hematuria    Hollice Espy, MD  [facility] [address], [place], [place] [address] [phone]  I spent 25 min with this patient of which greater than 50% was spent in counseling and coordination of care with the patient.

## 2017-12-03 NOTE — H&P (View-Only) (Signed)
12/03/2017 1:22 PM   Christopher Carlson Jun 02, 1940 [id number]  Referring provider: Maryland Pink, MD [address] [place], [place] [address]  Chief Complaint  Patient presents with  . Hematuria    HPI: 77 yo M with personal history of gross hematuria who returns today with recurrent ongoing episode.  He reports that about a week ago, he developed severe gross hematuria which was initially dark with a few clots.  The clots are subsiding but he still has persistent gross hematuria.  He denies any dysuria, bladder pain, or any other associated symptoms.  His voiding is at baseline.  Notably, he initially was seen and evaluated for gross hematuria and underwent CT urogram in September 2018 diagnosed with a massive saddle embolus incidentally.  After he recovered, he presented in 03/2017 for cystoscopy found to have a 8 French bulbar urethral stricture in the setting of multiple medical comorbidities and need for anticoagulation, no further intervention was pursued at the time.  He was last seen in our office on 09/2017 at which time he remained asymptomatic without recurrent episodes of gross hematuria.  At the time, his UA was completely negative without red blood cells.  He elected to not pursue any further intervention as this would require urethral dilation in the operating room.  He also has a history of elevated PSA, status post negative biopsies in the past.  Prostamegaly with estimated volume of 160 cc.  He is chronically on finasteride.  He was previously followed by a urologist in [place] for this.  Cardiologist [place]- currently on ASA / plavix s/p cardiac stent 1 year ago.  PMH: Past Medical History:  Diagnosis Date  . BPH (benign prostatic hyperplasia)   . CAD (coronary artery disease)   . High cholesterol   . Nephrolithiasis   . Thyroid disease     Surgical History: Past Surgical History:  Procedure Laterality Date  . CORONARY STENT INTERVENTION  N/A 10/12/2016   Procedure: CORONARY STENT INTERVENTION;  Surgeon: Yolonda Kida, MD;  Location: [facility];  Service: Cardiovascular;  Laterality: N/A;  . HERNIA REPAIR    . LEFT HEART CATH AND CORONARY ANGIOGRAPHY N/A 10/12/2016   Procedure: LEFT HEART CATH AND CORONARY ANGIOGRAPHY and possible pci;  Surgeon: Yolonda Kida, MD;  Location: [facility];  Service: Cardiovascular;  Laterality: N/A;    Home Medications:  Allergies as of 12/03/2017      Reactions   Ciprofloxacin Rash      Medication List        Accurate as of 12/03/17  1:22 PM. Always use your most recent med list.          aspirin EC 81 MG tablet Take 81 mg by mouth daily.   atorvastatin 40 MG tablet Commonly known as:  LIPITOR Take 40 mg by mouth daily.   clonazePAM 1 MG tablet Commonly known as:  KLONOPIN Take 0.5 mg by mouth at bedtime.   clopidogrel 75 MG tablet Commonly known as:  PLAVIX Take by mouth daily.   finasteride 5 MG tablet Commonly known as:  PROSCAR Take 5 mg by mouth daily.   levothyroxine 112 MCG tablet Commonly known as:  SYNTHROID, LEVOTHROID Take 112 mcg by mouth daily.   nitroGLYCERIN 0.4 MG SL tablet Commonly known as:  NITROSTAT Place 1 tablet (0.4 mg total) under the tongue every 5 (five) minutes as needed for chest pain.   pantoprazole 40 MG tablet Commonly known as:  PROTONIX  Take 40 mg by mouth daily.   QUEtiapine 50 MG tablet Commonly known as:  SEROQUEL Take 50 mg by mouth at bedtime.       Allergies:  Allergies  Allergen Reactions  . Ciprofloxacin Rash    Family History: Family History  Problem Relation Age of Onset  . Cancer Father   . Alcohol abuse Father   . Prostate cancer Neg Hx   . Bladder Cancer Neg Hx   . Kidney cancer Neg Hx     Social History:  reports that he has quit smoking. His smoking use included cigarettes. He has a 50.00 pack-year smoking history. He has never used smokeless tobacco. He reports that he  does not drink alcohol or use drugs.  ROS: UROLOGY Frequent Urination?: No Hard to postpone urination?: No Burning/pain with urination?: No Get up at night to urinate?: Yes Leakage of urine?: No Urine stream starts and stops?: Yes Trouble starting stream?: No Do you have to strain to urinate?: No Blood in urine?: Yes Urinary tract infection?: No Sexually transmitted disease?: No Injury to kidneys or bladder?: No Painful intercourse?: No Weak stream?: No Erection problems?: No Penile pain?: No  Gastrointestinal Nausea?: No Vomiting?: No Indigestion/heartburn?: No Diarrhea?: No Constipation?: No  Constitutional Fever: No Night sweats?: No Weight loss?: No Fatigue?: No  Skin Skin rash/lesions?: No Itching?: No  Eyes Blurred vision?: No Double vision?: No  Ears/Nose/Throat Sore throat?: No Sinus problems?: No  Hematologic/Lymphatic Swollen glands?: No Easy bruising?: No  Cardiovascular Leg swelling?: No Chest pain?: No  Respiratory Cough?: No Shortness of breath?: No  Endocrine Excessive thirst?: No  Musculoskeletal Back pain?: No Joint pain?: No  Neurological Headaches?: No Dizziness?: No  Psychologic Depression?: No Anxiety?: No  Physical Exam: BP 117/62   Pulse 98   Ht 6' (1.829 m)   Wt 185 lb (83.9 kg)   BMI 25.09 kg/m   Constitutional:  Alert and oriented, No acute distress. HEENT: [employer] AT, moist mucus membranes.  Trachea midline, no masses. Cardiovascular: No clubbing, cyanosis, or edema. Respiratory: Normal respiratory effort, no increased work of breathing. Skin: No rashes, bruises or suspicious lesions. Neurologic: Grossly intact, no focal deficits, moving all 4 extremities. Psychiatric: Normal mood and affect.  Laboratory Data: Lab Results  Component Value Date   WBC 7.1 07/13/2017   HGB 13.3 07/13/2017   HCT 39.0 (L) 07/13/2017   MCV 90.8 07/13/2017   PLT 295 07/13/2017    Lab Results  Component Value Date    CREATININE 1.19 07/13/2017    Urinalysis UA personally reviewed, there is greater than 30 red blood cells without concern for infection.  Pertinent Imaging: No new interval imaging  Assessment & Plan:    1. Gross hematuria Recurrent episodes of gross hematuria  Given that he has had yet another episode, I strongly recommended that we in fact do proceed to the operating room for urethral dilation, cystoscopy, bilateral retrograde pyelogram and possible intervention is deemed necessary.  We discussed the differential diagnosis for gross hematuria, most likely secondary to ambulate the platelet therapy in the setting of prostamegaly, however, other underlying etiologies cannot be ruled out without direct visualization.  Risk and benefits of surgery were discussed in detail with the patient including risk of bleeding, damage surrounding structures, need for postoperative Foley catheter for approximately 72 hours, injury to urethra or bladder amongst others.  All questions were answered.  Preop urine culture  He will need clearance from his cardiologist, Dr. Clayborn Bigness to hold his Plavix.  He can likely continue his aspirin for this procedure.  - Urinalysis, Complete - CULTURE, URINE COMPREHENSIVE  2. Postprocedural bulbous urethral stricture Unable to visualize bladder in the office secondary to bulbar urethral stricture, relatively asymptomatic from this  3. Benign prostatic hyperplasia with urinary obstruction Massive prostamegaly chronically on finasteride Possibly the source of gross hematuria    Hollice Espy, MD  [facility] [address], [place], [place] [address] [phone]  I spent 25 min with this patient of which greater than 50% was spent in counseling and coordination of care with the patient.

## 2017-12-03 NOTE — Telephone Encounter (Signed)
Patient was given the [facility] Information form below as well as the Instructions for Pre-Admission Testing form & a map of [facility].   [address], [address] [place], [place] [address] Telephone: [phone] Fax: [phone]   Thank you for choosing [facility] for your upcoming surgery!  We are always here to assist in your urological needs.  Please read the following information with specific details for your upcoming appointments related to your surgery. Please contact Aanyah Loa at [phone] Option 3 with any questions.  The Name of Your Surgery: Cystoscopy, urethral dilation, bilateral retrograde pyelograms, possible transurethral resection of bladder tumor, bladder biopsy Your Surgery Date: 12/16/2017 Your Surgeon: Hollice Espy  Please call Same Day Surgery at [phone] between the hours of 1pm-3pm one day prior to your surgery. They will inform you of the time to arrive at Same Day Surgery which is located on the second floor of the [facility].   Please refer to the attached letter regarding instructions for Pre-Admission Testing. You will receive a call from the [place] office regarding your appointment with them.  The Pre-Admission Testing office is located at [place], on the first floor of the [place] [facility] in [place] (office is to the right as you enter through the [employer] of the [facility]). Please have all medications you are currently taking and your insurance card available.   Patient was advised to have nothing to eat or drink after midnight the night prior to surgery except that he may have only water until 2 hours before surgery with nothing to drink within 2 hours of surgery.  The patient states he currently takes aspirin & plavix & was informed to continue aspirin prior to surgery &  he will be notified when to stop taking Plavix once clearance is received from Dr Clayborn Bigness. Patient's questions were answered and he expressed understanding of these instructions.

## 2017-12-05 LAB — CULTURE, URINE COMPREHENSIVE

## 2017-12-05 NOTE — Telephone Encounter (Signed)
Spoke with daughter, [name], notifying patient to continue taking aspirin as usual except hold medication on day of surgery & resume the day after surgery. Also stop Plavix beginning on 12/09/2017 & ask Dr Erlene Quan when medication may be resumed before discharge from Same Day Surgery on the day of the procedure. Questions answered. Daughter expresses understanding of these instructions.

## 2017-12-10 ENCOUNTER — Encounter
Admission: RE | Admit: 2017-12-10 | Discharge: 2017-12-10 | Disposition: A | Discharge status: Home / Self Care | Payer: Medicare HMO | Source: Ambulatory Visit | Attending: Urology | Admitting: Urology

## 2017-12-10 ENCOUNTER — Other Ambulatory Visit: Payer: Self-pay

## 2017-12-10 DIAGNOSIS — I452 Bifascicular block: Secondary | ICD-10-CM | POA: Insufficient documentation

## 2017-12-10 DIAGNOSIS — N99111 Postprocedural bulbous urethral stricture: Secondary | ICD-10-CM | POA: Diagnosis not present

## 2017-12-10 DIAGNOSIS — N401 Enlarged prostate with lower urinary tract symptoms: Secondary | ICD-10-CM | POA: Diagnosis not present

## 2017-12-10 DIAGNOSIS — R310 Gross hematuria: Secondary | ICD-10-CM | POA: Insufficient documentation

## 2017-12-10 DIAGNOSIS — R001 Bradycardia, unspecified: Secondary | ICD-10-CM | POA: Insufficient documentation

## 2017-12-10 DIAGNOSIS — N138 Other obstructive and reflux uropathy: Secondary | ICD-10-CM | POA: Diagnosis not present

## 2017-12-10 DIAGNOSIS — I2510 Atherosclerotic heart disease of native coronary artery without angina pectoris: Secondary | ICD-10-CM | POA: Diagnosis not present

## 2017-12-10 DIAGNOSIS — I25110 Atherosclerotic heart disease of native coronary artery with unstable angina pectoris: Secondary | ICD-10-CM | POA: Diagnosis not present

## 2017-12-10 DIAGNOSIS — Z01818 Encounter for other preprocedural examination: Secondary | ICD-10-CM | POA: Diagnosis not present

## 2017-12-10 HISTORY — DX: Acute myocardial infarction, unspecified: I21.9

## 2017-12-10 HISTORY — DX: Hypothyroidism, unspecified: E03.9

## 2017-12-10 HISTORY — DX: Gastro-esophageal reflux disease without esophagitis: K21.9

## 2017-12-10 LAB — BASIC METABOLIC PANEL
Anion gap: 8 (ref 5–15)
BUN: 16 mg/dL (ref 8–23)
CALCIUM: 9 mg/dL (ref 8.9–10.3)
CO2: 26 mmol/L (ref 22–32)
Chloride: 106 mmol/L (ref 98–111)
Creatinine, Ser: 1.25 mg/dL — ABNORMAL HIGH (ref 0.61–1.24)
GFR, EST NON AFRICAN AMERICAN: 54 mL/min — AB (ref >60)
Glucose, Bld: 92 mg/dL (ref 70–99)
Potassium: 4.6 mmol/L (ref 3.5–5.1)
SODIUM: 140 mmol/L (ref 135–145)

## 2017-12-10 LAB — CBC
HCT: 47.5 % (ref 39.0–52.0)
HEMOGLOBIN: 15.5 g/dL (ref 13.0–17.0)
MCH: 31.1 pg (ref 26.0–34.0)
MCHC: 32.6 g/dL (ref 30.0–36.0)
MCV: 95.2 fL (ref 80.0–100.0)
PLATELETS: 213 10*3/uL (ref 150–400)
RBC: 4.99 MIL/uL (ref 4.22–5.81)
RDW: 12.8 % (ref 11.5–15.5)
WBC: 7.3 10*3/uL (ref 4.0–10.5)
nRBC: 0 % (ref 0.0–0.2)

## 2017-12-10 NOTE — Patient Instructions (Signed)
  Your procedure is scheduled on: Monday December 16, 2017 Report to Same Day Surgery 2nd floor [facility] Entrance-take elevator on left to 2nd floor.  Check in with surgery information desk.) To find out your arrival time, call [phone] 1:00-3:00 PM on Friday December 13, 2017  Remember: Instructions that are not followed completely may result in serious medical risk, up to and including death, or upon the discretion of your surgeon and anesthesiologist your surgery may need to be rescheduled.    __x__ 1. Do not eat food (including mints, candies, chewing gum) after midnight the night before your procedure. You may drink water up to 2 hours before you are scheduled to arrive at the hospital for your procedure.  Do not drink anything within 2 hours of your scheduled arrival to the hospital.   __x__ 2. No Alcohol for 24 hours before or after surgery.   __x__ 3. No Smoking or e-cigarettes for 24 hours before surgery.  Do not use any chewable tobacco products for at least 6 hours before surgery.   __x__ 4. Notify your doctor if there is any change in your medical condition (cold, fever, infections).   __x__ 5. On the morning of surgery brush your teeth with toothpaste and water.  You may rinse your mouth with mouthwash if you wish.  Do not swallow any toothpaste or mouthwash.  Please read over the following fact sheets that you were given:   [facility] Preparing for Surgery and/or MRSA Information    __x__  Use antibacterial soap such as Dial to shower/bathe on the day of surgery.   Do not wear jewelry on the day of surgery.  Do not wear lotions, powders, deodorant, or perfumes.   Do not shave below the face/neck 48 hours prior to surgery.   Do not bring valuables to the hospital.    [facility] is not responsible for any belongings or valuables.               Contacts, dentures or bridgework may not be worn into surgery.  For patients discharged on the day of  surgery, you will NOT be permitted to drive yourself home.  You must have a responsible adult with you for 24 hours after surgery.  __x__ Take these medicines on the morning of surgery with a SMALL SIP OF WATER:  1. Levothyroxine (Synthroid)  2. Pantoprazole (Protonix)  3. Finasteride (Proscar)  __x__ Follow recommendations from Cardiologist, Pulmonologist or PCP regarding stopping Aspirin, Coumadin, Plavix, Eliquis, Effient, Pradaxa, and Pletal.  __x__ TODAY: Stop Anti-inflammatories such as Advil, Ibuprofen, Motrin, Aleve, Naproxen, Naprosyn, BC/Goodies powders or aspirin products. You may continue to take Tylenol and Celebrex.   __x__ TODAY: Stop supplements until after surgery. You may continue to take Vitamin D, Vitamin B, and multivitamin.

## 2017-12-10 NOTE — Pre-Procedure Instructions (Signed)
EKG COMPARED WITH 02/01/17

## 2017-12-15 MED ORDER — CEFAZOLIN SODIUM-DEXTROSE 2-4 GM/100ML-% IV SOLN
2.0000 g | INTRAVENOUS | Status: AC
  Administered 2017-12-16: 2 g via INTRAVENOUS

## 2017-12-16 ENCOUNTER — Ambulatory Visit
Admission: RE | Admit: 2017-12-16 | Discharge: 2017-12-16 | Disposition: A | Discharge status: Home / Self Care | Payer: Medicare HMO | Source: Ambulatory Visit | Attending: Urology | Admitting: Urology

## 2017-12-16 ENCOUNTER — Ambulatory Visit: Payer: Medicare HMO | Admitting: Anesthesiology

## 2017-12-16 ENCOUNTER — Encounter: Payer: Self-pay | Admitting: *Deleted

## 2017-12-16 ENCOUNTER — Telehealth: Payer: Self-pay | Admitting: Urology

## 2017-12-16 ENCOUNTER — Encounter
Admission: RE | Disposition: A | Discharge status: Home / Self Care | Payer: Self-pay | Source: Ambulatory Visit | Attending: Urology

## 2017-12-16 DIAGNOSIS — E7800 Pure hypercholesterolemia, unspecified: Secondary | ICD-10-CM | POA: Insufficient documentation

## 2017-12-16 DIAGNOSIS — Z7902 Long term (current) use of antithrombotics/antiplatelets: Secondary | ICD-10-CM | POA: Insufficient documentation

## 2017-12-16 DIAGNOSIS — Z87891 Personal history of nicotine dependence: Secondary | ICD-10-CM | POA: Insufficient documentation

## 2017-12-16 DIAGNOSIS — Z79890 Hormone replacement therapy (postmenopausal): Secondary | ICD-10-CM | POA: Insufficient documentation

## 2017-12-16 DIAGNOSIS — I2510 Atherosclerotic heart disease of native coronary artery without angina pectoris: Secondary | ICD-10-CM | POA: Insufficient documentation

## 2017-12-16 DIAGNOSIS — N35912 Unspecified bulbous urethral stricture, male: Secondary | ICD-10-CM | POA: Diagnosis not present

## 2017-12-16 DIAGNOSIS — N029 Recurrent and persistent hematuria with unspecified morphologic changes: Secondary | ICD-10-CM | POA: Diagnosis not present

## 2017-12-16 DIAGNOSIS — Z79899 Other long term (current) drug therapy: Secondary | ICD-10-CM | POA: Diagnosis not present

## 2017-12-16 DIAGNOSIS — R310 Gross hematuria: Secondary | ICD-10-CM | POA: Diagnosis not present

## 2017-12-16 DIAGNOSIS — N401 Enlarged prostate with lower urinary tract symptoms: Secondary | ICD-10-CM | POA: Insufficient documentation

## 2017-12-16 DIAGNOSIS — Z955 Presence of coronary angioplasty implant and graft: Secondary | ICD-10-CM | POA: Diagnosis not present

## 2017-12-16 DIAGNOSIS — Z7982 Long term (current) use of aspirin: Secondary | ICD-10-CM | POA: Diagnosis not present

## 2017-12-16 DIAGNOSIS — E039 Hypothyroidism, unspecified: Secondary | ICD-10-CM | POA: Diagnosis not present

## 2017-12-16 DIAGNOSIS — K219 Gastro-esophageal reflux disease without esophagitis: Secondary | ICD-10-CM | POA: Diagnosis not present

## 2017-12-16 DIAGNOSIS — N35919 Unspecified urethral stricture, male, unspecified site: Secondary | ICD-10-CM | POA: Insufficient documentation

## 2017-12-16 DIAGNOSIS — N99111 Postprocedural bulbous urethral stricture: Secondary | ICD-10-CM

## 2017-12-16 DIAGNOSIS — N138 Other obstructive and reflux uropathy: Secondary | ICD-10-CM | POA: Insufficient documentation

## 2017-12-16 HISTORY — PX: CYSTOSCOPY W/ RETROGRADES: SHX1426

## 2017-12-16 HISTORY — PX: CYSTOSCOPY WITH FULGERATION: SHX6638

## 2017-12-16 HISTORY — PX: CYSTOSCOPY WITH URETHRAL DILATATION: SHX5125

## 2017-12-16 SURGERY — CYSTOSCOPY, WITH URETHRAL DILATION
Anesthesia: General

## 2017-12-16 MED ORDER — SUCCINYLCHOLINE CHLORIDE 20 MG/ML IJ SOLN
INTRAMUSCULAR | Status: AC
  Filled 2017-12-16: qty 1

## 2017-12-16 MED ORDER — DEXAMETHASONE SODIUM PHOSPHATE 10 MG/ML IJ SOLN
INTRAMUSCULAR | Status: DC | PRN
  Administered 2017-12-16: 10 mg via INTRAVENOUS

## 2017-12-16 MED ORDER — FENTANYL CITRATE (PF) 100 MCG/2ML IJ SOLN
25.0000 ug | INTRAMUSCULAR | Status: DC | PRN
  Administered 2017-12-16 (×2): 50 ug via INTRAVENOUS

## 2017-12-16 MED ORDER — ONDANSETRON HCL 4 MG/2ML IJ SOLN
INTRAMUSCULAR | Status: AC
  Filled 2017-12-16: qty 2

## 2017-12-16 MED ORDER — ROCURONIUM BROMIDE 50 MG/5ML IV SOLN
INTRAVENOUS | Status: AC
  Filled 2017-12-16: qty 1

## 2017-12-16 MED ORDER — SODIUM CHLORIDE 0.9 % IV SOLN
INTRAVENOUS | Status: DC
  Administered 2017-12-16: 09:00:00 via INTRAVENOUS

## 2017-12-16 MED ORDER — FENTANYL CITRATE (PF) 100 MCG/2ML IJ SOLN
INTRAMUSCULAR | Status: DC | PRN
  Administered 2017-12-16: 50 ug via INTRAVENOUS
  Administered 2017-12-16 (×2): 25 ug via INTRAVENOUS

## 2017-12-16 MED ORDER — OXYCODONE HCL 5 MG PO TABS: 5.0000 mg | ORAL_TABLET | Freq: Once | ORAL | Status: DC | PRN

## 2017-12-16 MED ORDER — OXYCODONE HCL 5 MG/5ML PO SOLN: 5.0000 mg | Freq: Once | ORAL | Status: DC | PRN

## 2017-12-16 MED ORDER — CEFAZOLIN SODIUM-DEXTROSE 2-4 GM/100ML-% IV SOLN
INTRAVENOUS | Status: AC
  Filled 2017-12-16: qty 100

## 2017-12-16 MED ORDER — LIDOCAINE HCL (PF) 2 % IJ SOLN
INTRAMUSCULAR | Status: AC
  Filled 2017-12-16: qty 10

## 2017-12-16 MED ORDER — KETOROLAC TROMETHAMINE 30 MG/ML IJ SOLN
INTRAMUSCULAR | Status: AC
  Filled 2017-12-16: qty 1

## 2017-12-16 MED ORDER — LIDOCAINE HCL (CARDIAC) PF 100 MG/5ML IV SOSY
PREFILLED_SYRINGE | INTRAVENOUS | Status: DC | PRN
  Administered 2017-12-16: 100 mg via INTRAVENOUS

## 2017-12-16 MED ORDER — IOPAMIDOL (ISOVUE-200) INJECTION 41%
INTRAVENOUS | Status: DC | PRN
  Administered 2017-12-16: 20 mL via INTRAVENOUS

## 2017-12-16 MED ORDER — PROPOFOL 10 MG/ML IV BOLUS
INTRAVENOUS | Status: AC
  Filled 2017-12-16: qty 20

## 2017-12-16 MED ORDER — HYDROCODONE-ACETAMINOPHEN 5-325 MG PO TABS: 1.0000 | ORAL_TABLET | Freq: Four times a day (QID) | ORAL | 0 refills | Status: DC | PRN

## 2017-12-16 MED ORDER — PROPOFOL 10 MG/ML IV BOLUS
INTRAVENOUS | Status: DC | PRN
  Administered 2017-12-16: 150 mg via INTRAVENOUS

## 2017-12-16 MED ORDER — FENTANYL CITRATE (PF) 100 MCG/2ML IJ SOLN
INTRAMUSCULAR | Status: AC
  Administered 2017-12-16: 50 ug via INTRAVENOUS
  Filled 2017-12-16: qty 2

## 2017-12-16 MED ORDER — DEXAMETHASONE SODIUM PHOSPHATE 10 MG/ML IJ SOLN
INTRAMUSCULAR | Status: AC
  Filled 2017-12-16: qty 1

## 2017-12-16 MED ORDER — ONDANSETRON HCL 4 MG/2ML IJ SOLN
INTRAMUSCULAR | Status: DC | PRN
  Administered 2017-12-16: 4 mg via INTRAVENOUS

## 2017-12-16 MED ORDER — KETOROLAC TROMETHAMINE 30 MG/ML IJ SOLN
INTRAMUSCULAR | Status: DC | PRN
  Administered 2017-12-16: 30 mg via INTRAVENOUS

## 2017-12-16 MED ORDER — FENTANYL CITRATE (PF) 100 MCG/2ML IJ SOLN
INTRAMUSCULAR | Status: AC
  Filled 2017-12-16: qty 2

## 2017-12-16 MED ORDER — LACTATED RINGERS IV SOLN
INTRAVENOUS | Status: DC | PRN
  Administered 2017-12-16 (×2): via INTRAVENOUS

## 2017-12-16 SURGICAL SUPPLY — 40 items
BAG DRAIN CYSTO-URO LG1000N (MISCELLANEOUS) ×5 IMPLANT
BAG URINE DRAINAGE (UROLOGICAL SUPPLIES) ×5 IMPLANT
BRUSH SCRUB EZ  4% CHG (MISCELLANEOUS) ×2
BRUSH SCRUB EZ 4% CHG (MISCELLANEOUS) ×3 IMPLANT
CATH FOL 2WAY LX 16X5 (CATHETERS) IMPLANT
CATH FOLEY 2W COUNCIL 5CC 18FR (CATHETERS) ×5 IMPLANT
CATH FOLEY 2WAY  5CC 16FR (CATHETERS) ×2
CATH SET URETHRAL DILATOR (CATHETERS) ×5 IMPLANT
CATH URETL 5X70 OPEN END (CATHETERS) ×5 IMPLANT
CATH URTH 16FR FL 2W BLN LF (CATHETERS) ×3 IMPLANT
COVER WAND RF STERILE (DRAPES) ×5 IMPLANT
CRADLE LAMINECT ARM (MISCELLANEOUS) ×5 IMPLANT
DRAPE UTILITY 15X26 TOWEL STRL (DRAPES) ×5 IMPLANT
DRSG TELFA 4X3 1S NADH ST (GAUZE/BANDAGES/DRESSINGS) IMPLANT
ELECT LOOP 22F BIPOLAR SML (ELECTROSURGICAL)
ELECT REM PT RETURN 9FT ADLT (ELECTROSURGICAL)
ELECTRODE LOOP 22F BIPOLAR SML (ELECTROSURGICAL) IMPLANT
ELECTRODE REM PT RTRN 9FT ADLT (ELECTROSURGICAL) IMPLANT
GLIDEWIRE STIFF .35X180X3 HYDR (WIRE) ×5 IMPLANT
GLOVE BIO SURGEON STRL SZ 6.5 (GLOVE) ×4 IMPLANT
GLOVE BIO SURGEONS STRL SZ 6.5 (GLOVE) ×1
GOWN STRL REUS W/ TWL LRG LVL3 (GOWN DISPOSABLE) ×6 IMPLANT
GOWN STRL REUS W/TWL LRG LVL3 (GOWN DISPOSABLE) ×4
KIT TURNOVER CYSTO (KITS) ×5 IMPLANT
LOOP CUT BIPOLAR 24F LRG (ELECTROSURGICAL) IMPLANT
NDL SAFETY ECLIPSE 18X1.5 (NEEDLE) ×3 IMPLANT
NEEDLE HYPO 18GX1.5 SHARP (NEEDLE) ×2
PACK CYSTO AR (MISCELLANEOUS) ×5 IMPLANT
SENSORWIRE 0.038 NOT ANGLED (WIRE) ×5
SET CYSTO W/LG BORE CLAMP LF (SET/KITS/TRAYS/PACK) ×5 IMPLANT
SET IRRIG Y TYPE TUR BLADDER L (SET/KITS/TRAYS/PACK) IMPLANT
SOL .9 NS 3000ML IRR  AL (IV SOLUTION) ×4
SOL .9 NS 3000ML IRR UROMATIC (IV SOLUTION) ×6 IMPLANT
SURGILUBE 2OZ TUBE FLIPTOP (MISCELLANEOUS) ×5 IMPLANT
SYR 10ML LL (SYRINGE) ×5 IMPLANT
SYR 30ML LL (SYRINGE) ×5 IMPLANT
SYRINGE IRR TOOMEY STRL 70CC (SYRINGE) ×5 IMPLANT
WATER STERILE IRR 1000ML POUR (IV SOLUTION) ×5 IMPLANT
WATER STERILE IRR 3000ML UROMA (IV SOLUTION) ×5 IMPLANT
WIRE SENSOR 0.038 NOT ANGLED (WIRE) ×3 IMPLANT

## 2017-12-16 NOTE — Telephone Encounter (Signed)
Patient's daughter called and wanted to know when he needed to have his cath removed?   Sharyn Lull

## 2017-12-16 NOTE — Anesthesia Post-op Follow-up Note (Signed)
Anesthesia QCDR form completed.        

## 2017-12-16 NOTE — Progress Notes (Signed)
Pt and family states understanding of catheter care

## 2017-12-16 NOTE — Anesthesia Procedure Notes (Signed)
Procedure Name: LMA Insertion Date/Time: 12/16/2017 10:13 AM Performed by: Philbert Riser, CRNA Pre-anesthesia Checklist: Patient identified, Emergency Drugs available, Suction available, Patient being monitored and Timeout performed Patient Re-evaluated:Patient Re-evaluated prior to induction Oxygen Delivery Method: Circle system utilized and Simple face mask Preoxygenation: Pre-oxygenation with 100% oxygen Induction Type: IV induction Ventilation: Mask ventilation without difficulty LMA Size: 4.0

## 2017-12-16 NOTE — Op Note (Signed)
Date of procedure: 12/16/17  Preoperative diagnosis:  1. Gross hematuria 2. Urethral stricture 3. History of BPH  Postoperative diagnosis:  1. Same as above  Procedure: 1. Cystoscopy 2. Urethral dilation 3. Bilateral retrograde pyelogram 4. Fulguration of bleeding prostatic vessels  Surgeon: Hollice Espy, MD  Anesthesia: General  Complications: None  Intraoperative findings: Massive prostate with trilobar coaptation, elevated bladder neck with friable mucosa, significant bleeding with manipulation.  Bilateral retrograde pyelogram with J hooking of distal ureters, otherwise no filling defects or hydronephrosis.  No intravesical tumors identified, trabeculated bladder.  EBL: Minimal  Specimens: None  Drains: 76 French council tip Foley catheter with 10 cc balloon  Indication: Christopher Carlson is a 77 y.o. patient with gross hematuria found of urethral stricture with inability to visualize the bladder in the office.  After reviewing the management options for treatment, he elected to proceed with the above surgical procedure(s). We have discussed the potential benefits and risks of the procedure, side effects of the proposed treatment, the likelihood of the patient achieving the goals of the procedure, and any potential problems that might occur during the procedure or recuperation. Informed consent has been obtained.  Description of procedure:  The patient was taken to the operating room and general anesthesia was induced.  The patient was placed in the dorsal lithotomy position, prepped and draped in the usual sterile fashion, and preoperative antibiotics were administered. A preoperative time-out was performed.   A 21 French scope was advanced to the level of the urethral stricture within the bulbar urethra.  It was noted to be approximately 10 [place] in diameter.  A wire was placed directly through this area under direct visualization into the bladder confirmed  fluoroscopically.  Cook urethral dilators were then used to serially dilate the urethra from 10 [place] up to 24 [place].  Notably, there was bleeding with dilation.  62 French cystoscope then advanced through a massively enlarged friable prostate into the bladder.  The bladder neck was markedly elevated than the bladder was trabeculated.  There is bleeding at the bladder neck and from the prostatic mucosa appreciated.  Visualization at this point was relatively poor due to active bleeding thus the regular cystoscope was exchanged for 26 French resectoscope and continuous irrigation was used.  The entire bladder was inspected and there were no tumors, ulcerations, or any other concerning lesions.  There were no stones.  Using the laser bridge, 5 [place] open-ended ureteral catheter was introduced into each ureteral orifice which was difficult technically due to the elevation of bladder neck but ultimately successful.  On each side, there was a fairly significant J hooking of the distal ureter but no other hydroureteronephrosis or filling defects bilaterally.  Finally, due to ongoing and fairly significant bleeding, the bipolar loop was brought in and the bladder neck as well as prostatic mucosa was fulgurated until hemostasis was adequate.  The bladder was drained and the water was able to be turned off without any significant active bleeding noted.  The scope was then removed.  An 55 French two-way Foley catheter was then placed into the bladder without difficulty.  The balloon was inflated with 10 cc of sterile water.  Urinary return was light pink without clots.  There were some slight bleeding around the catheter appreciated.  Patient was then clean and dry, repositioned in supine position, first met seizure, taken to PACU in stable condition.  Plan: We will leave the Foley catheter for 72 hours and a voiding trial in  the office.  He was advised to continue aspirin given his cardiac risk factor and resume  Plavix if his if his urine remains relatively clear.  Hollice Espy, M.D.

## 2017-12-16 NOTE — Progress Notes (Signed)
Urine draining but is blood tinged

## 2017-12-16 NOTE — Discharge Instructions (Signed)
You may continue aspirin.  If your urine is relatively clear tomorrow, you may also resume Plavix.   Indwelling Urinary Catheter Care, Adult Take good care of your catheter to keep it working and to prevent problems. How to wear your catheter Attach your catheter to your leg with tape (adhesive tape) or a leg strap. Make sure it is not too tight. If you use tape, remove any bits of tape that are already on the catheter. How to wear a drainage bag You should have:  A large overnight bag.  A small leg bag.  Overnight Bag You may wear the overnight bag at any time. Always keep the bag below the level of your bladder but off the floor. When you sleep, put a clean plastic bag in a wastebasket. Then hang the bag inside the wastebasket. Leg Bag Never wear the leg bag at night. Always wear the leg bag below your knee. Keep the leg bag secure with a leg strap or tape. How to care for your skin  Clean the skin around the catheter at least once every day.  Shower every day. Do not take baths.  Put creams, lotions, or ointments on your genital area only as told by your doctor.  Do not use powders, sprays, or lotions on your genital area. How to clean your catheter and your skin 1. Wash your hands with soap and water. 2. Wet a washcloth in warm water and gentle (mild) soap. 3. Use the washcloth to clean the skin where the catheter enters your body. Clean downward and wipe away from the catheter in small circles. Do not wipe toward the catheter. 4. Pat the area dry with a clean towel. Make sure to clean off all soap. How to care for your drainage bags Empty your drainage bag when it is ?- full or at least 2-3 times a day. Replace your drainage bag once a month or sooner if it starts to smell bad or look dirty. Do not clean your drainage bag unless told by your doctor. Emptying a drainage bag  Supplies Needed  Rubbing alcohol.  Gauze pad or cotton ball.  Tape or a leg  strap.  Steps 1. Wash your hands with soap and water. 2. Separate (detach) the bag from your leg. 3. Hold the bag over the toilet or a clean container. Keep the bag below your hips and bladder. This stops pee (urine) from going back into the tube. 4. Open the pour spout at the bottom of the bag. 5. Empty the pee into the toilet or container. Do not let the pour spout touch any surface. 6. Put rubbing alcohol on a gauze pad or cotton ball. 7. Use the gauze pad or cotton ball to clean the pour spout. 8. Close the pour spout. 9. Attach the bag to your leg with tape or a leg strap. 10. Wash your hands.  Changing a drainage bag Supplies Needed  Alcohol wipes.  A clean drainage bag.  Adhesive tape or a leg strap.  Steps 1. Wash your hands with soap and water. 2. Separate the dirty bag from your leg. 3. Pinch the rubber catheter with your fingers so that pee does not spill out. 4. Separate the catheter tube from the drainage tube where these tubes connect (at the connection valve). Do not let the tubes touch any surface. 5. Clean the end of the catheter tube with an alcohol wipe. Use a different alcohol wipe to clean the end of the drainage tube.  6. Connect the catheter tube to the drainage tube of the clean bag. 7. Attach the new bag to the leg with adhesive tape or a leg strap. 8. Wash your hands.  How to prevent infection and other problems  Never pull on your catheter or try to remove it. Pulling can damage tissue in your body.  Always wash your hands before and after touching your catheter.  If a leg strap gets wet, replace it with a dry one.  Drink enough fluids to keep your pee clear or pale yellow, or as told by your doctor.  Do not let the drainage bag or tubing touch the floor.  Wear cotton underwear.  If you are male, wipe from front to back after you poop (have a bowel movement).  Check on the catheter often to make sure it works and the tubing is not  twisted. Get help if:  Your pee is cloudy.  Your pee smells unusually bad.  Your pee is not draining into the bag.  Your tube gets clogged.  Your catheter starts to leak.  Your bladder feels full. Get help right away if:  You have redness, swelling, or pain where the catheter enters your body.  You have fluid, pus, or a bad smell coming from the area where the catheter enters your body.  The area where the catheter enters your body feels warm.  You have a fever.  You have pain in your: ? Stomach (abdomen). ? Legs. ? Lower back. ? Bladder.  You see blood fill the catheter.  Your pee is pink or red.  You feel sick to your stomach (nauseous).  You throw up (vomit).  You have chills.  Your catheter gets pulled out. This information is not intended to replace advice given to you by your health care provider. Make sure you discuss any questions you have with your health care provider. Document Released: 05/12/2012 Document Revised: 12/14/2015 Document Reviewed: 06/30/2013 Elsevier Interactive Patient Education  [employer].

## 2017-12-16 NOTE — Anesthesia Postprocedure Evaluation (Signed)
Anesthesia Post Note  Patient: Christopher Carlson  Procedure(s) Performed: CYSTOSCOPY WITH URETHRAL DILATATION (N/A ) CYSTOSCOPY WITH RETROGRADE PYELOGRAM (Bilateral ) CYSTOSCOPY WITH FULGERATION of bleeding prostate  Patient location during evaluation: PACU Anesthesia Type: General Level of consciousness: awake and alert Pain management: pain level controlled Vital Signs Assessment: post-procedure vital signs reviewed and stable Respiratory status: spontaneous breathing, nonlabored ventilation, respiratory function stable and patient connected to nasal cannula oxygen Cardiovascular status: blood pressure returned to baseline and stable Postop Assessment: no apparent nausea or vomiting Anesthetic complications: no     Last Vitals:  Vitals:   12/16/17 1202 12/16/17 1226  BP: 136/73 (!) 142/72  Pulse: (!) 58 (!) 56  Resp: 16   Temp: (!) 35.9 C   SpO2: 95% 97%    Last Pain:  Vitals:   12/16/17 1226  TempSrc:   PainSc: 3                  Precious Haws Piscitello

## 2017-12-16 NOTE — Transfer of Care (Signed)
Immediate Anesthesia Transfer of Care Note  Patient: Christopher Carlson  Procedure(s) Performed: CYSTOSCOPY WITH URETHRAL DILATATION (N/A ) CYSTOSCOPY WITH RETROGRADE PYELOGRAM (Bilateral ) CYSTOSCOPY WITH FULGERATION of bleeding prostate  Patient Location: PACU  Anesthesia Type:General  Level of Consciousness: awake, alert  and oriented  Airway & Oxygen Therapy: Patient Spontanous Breathing and Patient connected to face mask oxygen  Post-op Assessment: Report given to RN and Post -op Vital signs reviewed and stable  Post vital signs: Reviewed and stable  Last Vitals:  Vitals Value Taken Time  BP 126/61 12/16/2017 10:58 AM  Temp    Pulse 61 12/16/2017 10:58 AM  Resp 8 12/16/2017 10:58 AM  SpO2 99 % 12/16/2017 10:58 AM  Vitals shown include unvalidated device data.  Last Pain:  Vitals:   12/16/17 0903  TempSrc: Temporal  PainSc: 0-No pain         Complications: No apparent anesthesia complications

## 2017-12-16 NOTE — Telephone Encounter (Signed)
I am not sure why the PACU did not contact you.  I put his discharge instruction and orders, I put he needs a nurse visit in 3 days for catheter removal.  I would also like to see him in 6 weeks for recheck in my clinic.  Hollice Espy, MD

## 2017-12-16 NOTE — Anesthesia Preprocedure Evaluation (Signed)
Anesthesia Evaluation  Patient identified by MRN, date of birth, ID band Patient awake    Reviewed: Allergy & Precautions, H&P , NPO status , Patient's Chart, lab work & pertinent test results  History of Anesthesia Complications Negative for: history of anesthetic complications  Airway Mallampati: III  TM Distance: >3 FB Neck ROM: limited    Dental  (+) Chipped, Poor Dentition, Missing, Upper Dentures   Pulmonary neg shortness of breath, former smoker,           Cardiovascular Exercise Tolerance: Good (-) angina+ CAD, + Past MI and + Cardiac Stents  (-) DOE + Valvular Problems/Murmurs AI      Neuro/Psych negative neurological ROS  negative psych ROS   GI/Hepatic Neg liver ROS, GERD  Medicated and Controlled,  Endo/Other  Hypothyroidism   Renal/GU Renal disease     Musculoskeletal   Abdominal   Peds  Hematology negative hematology ROS (+)   Anesthesia Other Findings Past Medical History: No date: BPH (benign prostatic hyperplasia) No date: CAD (coronary artery disease) No date: GERD (gastroesophageal reflux disease) No date: High cholesterol No date: Hypothyroidism 09/2016: Myocardial infarction ([place]) No date: Nephrolithiasis No date: Thyroid disease  Past Surgical History: 10/12/2016: CORONARY STENT INTERVENTION; N/A     Comment:  Procedure: CORONARY STENT INTERVENTION;  Surgeon:               Yolonda Kida, MD;  Location: [facility];               Service: Cardiovascular;  Laterality: N/A; No date: HERNIA REPAIR 10/12/2016: LEFT HEART CATH AND CORONARY ANGIOGRAPHY; N/A     Comment:  Procedure: LEFT HEART CATH AND CORONARY ANGIOGRAPHY and               possible pci;  Surgeon: Yolonda Kida, MD;                Location: [facility];  Service: Cardiovascular;              Laterality: N/A;  BMI    Body Mass Index:  25.09 kg/m      Reproductive/Obstetrics negative OB  ROS                             Anesthesia Physical Anesthesia Plan  ASA: III  Anesthesia Plan: General LMA   Post-op Pain Management:    Induction: Intravenous  PONV Risk Score and Plan: Ondansetron, Dexamethasone, Midazolam and Treatment may vary due to age or medical condition  Airway Management Planned: LMA  Additional Equipment:   Intra-op Plan:   Post-operative Plan: Extubation in OR  Informed Consent: I have reviewed the patients History and Physical, chart, labs and discussed the procedure including the risks, benefits and alternatives for the proposed anesthesia with the patient or authorized representative who has indicated his/her understanding and acceptance.   Dental Advisory Given  Plan Discussed with: Anesthesiologist, CRNA and Surgeon  Anesthesia Plan Comments: (Patient consented for risks of anesthesia including but not limited to:  - adverse reactions to medications - damage to teeth, lips or other oral mucosa - sore throat or hoarseness - Damage to heart, brain, lungs or loss of life  Patient voiced understanding.)        Anesthesia Quick Evaluation

## 2017-12-16 NOTE — Interval H&P Note (Signed)
History and Physical Interval Note:  12/16/2017 9:45 AM  Christopher Carlson  has presented today for surgery, with the diagnosis of gross hematuria, urethral stricture, BPH with urinary obstruction  The various methods of treatment have been discussed with the patient and family. After consideration of risks, benefits and other options for treatment, the patient has consented to  Procedure(s): CYSTOSCOPY WITH URETHRAL DILATATION (N/A) CYSTOSCOPY WITH RETROGRADE PYELOGRAM (Bilateral) TRANSURETHRAL RESECTION OF BLADDER TUMOR (TURBT) (N/A) CYSTOSCOPY WITH BLADDER BIOPSY (N/A) as a surgical intervention .  The patient's history has been reviewed, patient examined, no change in status, stable for surgery.  I have reviewed the patient's chart and labs.  Questions were answered to the patient's satisfaction.    RRR CTAB  Hollice Espy

## 2017-12-17 ENCOUNTER — Encounter: Payer: Self-pay | Admitting: Urology

## 2017-12-17 NOTE — Telephone Encounter (Signed)
I have him scheduled for the cath removal and his daughter wanted to push his follow up out til after the first of the year because he will be out of town for Christmas.   Sharyn Lull

## 2017-12-17 NOTE — Telephone Encounter (Signed)
Totally fine.    Hollice Espy, MD

## 2017-12-19 ENCOUNTER — Ambulatory Visit (INDEPENDENT_AMBULATORY_CARE_PROVIDER_SITE_OTHER): Payer: Medicare HMO

## 2017-12-19 DIAGNOSIS — Z87898 Personal history of other specified conditions: Secondary | ICD-10-CM | POA: Diagnosis not present

## 2017-12-19 DIAGNOSIS — Z4889 Encounter for other specified surgical aftercare: Secondary | ICD-10-CM | POA: Diagnosis not present

## 2017-12-19 LAB — BLADDER SCAN AMB NON-IMAGING

## 2017-12-19 NOTE — Progress Notes (Signed)
Simple Catheter Placement  Due to urinary retention patient is present today for a foley cath placement.  Patient was cleaned and prepped in a sterile fashion with betadine and lidocaine jelly 2% was instilled into the urethra.  A 16 coude FR foley catheter was inserted, urine return was noted  660ml, urine was red in color.  The balloon was filled with 10cc of sterile water.  A leg bag was attached for drainage. Patient was also given a night bag to take home and was given instruction on how to change from one bag to another.  Patient was given instruction on proper catheter care.  Patient tolerated well, no complications were noted   Preformed by: Fonnie Jarvis, CMA  Additional notes/ Follow up: per Dr. Erlene Quan patient to keep foley in until next Wednesday. Patient and daughter were given detailed instruction and demonstration on foley care and irrigation if foley stops draining and sent home with supplies.

## 2017-12-19 NOTE — Progress Notes (Signed)
Catheter Removal  Patient is present today for a catheter removal.  13ml of water was drained from the balloon. A 18FR foley cath was removed from the bladder no complications were noted . Patient tolerated well. Upon removal of catheter patient began to bleed from the penis, and passes several quarter sized clots. PVR was performed, volume was 110mL Spoke with Dr.Brandon who advises patient RTC this after noon for PVR and reassessment.   Preformed by: Gordy Clement, CMA (AAMA)  Follow up/ Additional notes: RTC this afternoon between 2-3pm.

## 2017-12-21 ENCOUNTER — Observation Stay
Admission: EM | Admit: 2017-12-21 | Discharge: 2017-12-24 | Disposition: A | Discharge status: Home / Self Care | Payer: Medicare HMO | Attending: Urology | Admitting: Urology

## 2017-12-21 ENCOUNTER — Other Ambulatory Visit: Payer: Self-pay

## 2017-12-21 ENCOUNTER — Encounter: Payer: Self-pay | Admitting: Emergency Medicine

## 2017-12-21 DIAGNOSIS — I252 Old myocardial infarction: Secondary | ICD-10-CM | POA: Insufficient documentation

## 2017-12-21 DIAGNOSIS — Z7902 Long term (current) use of antithrombotics/antiplatelets: Secondary | ICD-10-CM | POA: Insufficient documentation

## 2017-12-21 DIAGNOSIS — D62 Acute posthemorrhagic anemia: Secondary | ICD-10-CM | POA: Insufficient documentation

## 2017-12-21 DIAGNOSIS — Z7982 Long term (current) use of aspirin: Secondary | ICD-10-CM | POA: Diagnosis not present

## 2017-12-21 DIAGNOSIS — T83098A Other mechanical complication of other indwelling urethral catheter, initial encounter: Secondary | ICD-10-CM | POA: Diagnosis not present

## 2017-12-21 DIAGNOSIS — Z87891 Personal history of nicotine dependence: Secondary | ICD-10-CM | POA: Insufficient documentation

## 2017-12-21 DIAGNOSIS — Z955 Presence of coronary angioplasty implant and graft: Secondary | ICD-10-CM | POA: Diagnosis not present

## 2017-12-21 DIAGNOSIS — I2510 Atherosclerotic heart disease of native coronary artery without angina pectoris: Secondary | ICD-10-CM | POA: Insufficient documentation

## 2017-12-21 DIAGNOSIS — K219 Gastro-esophageal reflux disease without esophagitis: Secondary | ICD-10-CM | POA: Diagnosis not present

## 2017-12-21 DIAGNOSIS — Z79899 Other long term (current) drug therapy: Secondary | ICD-10-CM | POA: Insufficient documentation

## 2017-12-21 DIAGNOSIS — Z86711 Personal history of pulmonary embolism: Secondary | ICD-10-CM | POA: Diagnosis not present

## 2017-12-21 DIAGNOSIS — R310 Gross hematuria: Secondary | ICD-10-CM | POA: Diagnosis not present

## 2017-12-21 DIAGNOSIS — R319 Hematuria, unspecified: Secondary | ICD-10-CM

## 2017-12-21 DIAGNOSIS — R339 Retention of urine, unspecified: Secondary | ICD-10-CM

## 2017-12-21 DIAGNOSIS — E7800 Pure hypercholesterolemia, unspecified: Secondary | ICD-10-CM | POA: Diagnosis not present

## 2017-12-21 DIAGNOSIS — Z79890 Hormone replacement therapy (postmenopausal): Secondary | ICD-10-CM | POA: Insufficient documentation

## 2017-12-21 DIAGNOSIS — E039 Hypothyroidism, unspecified: Secondary | ICD-10-CM | POA: Diagnosis not present

## 2017-12-21 LAB — PROTIME-INR
INR: 1.07
PROTHROMBIN TIME: 13.8 s (ref 11.4–15.2)

## 2017-12-21 LAB — URINALYSIS, COMPLETE (UACMP) WITH MICROSCOPIC: Specific Gravity, Urine: 1.018 (ref 1.005–1.030)

## 2017-12-21 LAB — CBC WITH DIFFERENTIAL/PLATELET
Abs Immature Granulocytes: 0.02 10*3/uL (ref 0.00–0.07)
BASOS PCT: 1 %
Basophils Absolute: 0.1 10*3/uL (ref 0.0–0.1)
EOS ABS: 0.4 10*3/uL (ref 0.0–0.5)
EOS PCT: 6 %
HCT: 42.5 % (ref 39.0–52.0)
Hemoglobin: 13.9 g/dL (ref 13.0–17.0)
Immature Granulocytes: 0 %
Lymphocytes Relative: 15 %
Lymphs Abs: 0.9 10*3/uL (ref 0.7–4.0)
MCH: 30.5 pg (ref 26.0–34.0)
MCHC: 32.7 g/dL (ref 30.0–36.0)
MCV: 93.4 fL (ref 80.0–100.0)
MONO ABS: 0.6 10*3/uL (ref 0.1–1.0)
Monocytes Relative: 10 %
Neutro Abs: 4.2 10*3/uL (ref 1.7–7.7)
Neutrophils Relative %: 68 %
PLATELETS: 217 10*3/uL (ref 150–400)
RBC: 4.55 MIL/uL (ref 4.22–5.81)
RDW: 12.9 % (ref 11.5–15.5)
WBC: 6.1 10*3/uL (ref 4.0–10.5)
nRBC: 0 % (ref 0.0–0.2)

## 2017-12-21 LAB — BASIC METABOLIC PANEL
ANION GAP: 10 (ref 5–15)
BUN: 22 mg/dL (ref 8–23)
CHLORIDE: 103 mmol/L (ref 98–111)
CO2: 26 mmol/L (ref 22–32)
Calcium: 9.1 mg/dL (ref 8.9–10.3)
Creatinine, Ser: 1.5 mg/dL — ABNORMAL HIGH (ref 0.61–1.24)
GFR calc Af Amer: 50 mL/min — ABNORMAL LOW (ref >60)
GFR, EST NON AFRICAN AMERICAN: 43 mL/min — AB (ref >60)
Glucose, Bld: 101 mg/dL — ABNORMAL HIGH (ref 70–99)
POTASSIUM: 4 mmol/L (ref 3.5–5.1)
Sodium: 139 mmol/L (ref 135–145)

## 2017-12-21 LAB — TYPE AND SCREEN
ABO/RH(D): B POS
Antibody Screen: NEGATIVE

## 2017-12-21 MED ORDER — OXYBUTYNIN CHLORIDE ER 5 MG PO TB24
10.0000 mg | ORAL_TABLET | Freq: Every day | ORAL | Status: DC
  Administered 2017-12-21 – 2017-12-22 (×2): 10 mg via ORAL
  Filled 2017-12-21 (×2): qty 2

## 2017-12-21 MED ORDER — SODIUM CHLORIDE 0.9 % IV SOLN
1.0000 g | Freq: Once | INTRAVENOUS | Status: AC
  Administered 2017-12-21: 1 g via INTRAVENOUS
  Filled 2017-12-21: qty 1

## 2017-12-21 MED ORDER — ASPIRIN EC 81 MG PO TBEC
81.0000 mg | DELAYED_RELEASE_TABLET | Freq: Every day | ORAL | Status: DC
  Administered 2017-12-22 – 2017-12-24 (×3): 81 mg via ORAL
  Filled 2017-12-21 (×4): qty 1

## 2017-12-21 MED ORDER — ATORVASTATIN CALCIUM 20 MG PO TABS
40.0000 mg | ORAL_TABLET | Freq: Every day | ORAL | Status: DC
  Administered 2017-12-21 – 2017-12-22 (×2): 40 mg via ORAL
  Filled 2017-12-21 (×4): qty 2

## 2017-12-21 MED ORDER — PANTOPRAZOLE SODIUM 40 MG PO TBEC
40.0000 mg | DELAYED_RELEASE_TABLET | Freq: Every day | ORAL | Status: DC
  Administered 2017-12-21 – 2017-12-24 (×4): 40 mg via ORAL
  Filled 2017-12-21 (×4): qty 1

## 2017-12-21 MED ORDER — ONDANSETRON HCL 4 MG/2ML IJ SOLN: 4.0000 mg | INTRAMUSCULAR | Status: DC | PRN

## 2017-12-21 MED ORDER — FINASTERIDE 5 MG PO TABS
5.0000 mg | ORAL_TABLET | Freq: Every day | ORAL | Status: DC
  Administered 2017-12-21 – 2017-12-24 (×4): 5 mg via ORAL
  Filled 2017-12-21 (×4): qty 1

## 2017-12-21 MED ORDER — HYDROCODONE-ACETAMINOPHEN 5-325 MG PO TABS
1.0000 | ORAL_TABLET | Freq: Four times a day (QID) | ORAL | Status: DC | PRN
  Administered 2017-12-21 – 2017-12-22 (×3): 2 via ORAL
  Administered 2017-12-24: 1 via ORAL
  Filled 2017-12-21 (×3): qty 2
  Filled 2017-12-21: qty 1

## 2017-12-21 MED ORDER — NITROGLYCERIN 0.4 MG SL SUBL: 0.4000 mg | SUBLINGUAL_TABLET | SUBLINGUAL | Status: DC | PRN

## 2017-12-21 MED ORDER — LEVOTHYROXINE SODIUM 112 MCG PO TABS
112.0000 ug | ORAL_TABLET | Freq: Every day | ORAL | Status: DC
  Administered 2017-12-22 – 2017-12-24 (×3): 112 ug via ORAL
  Filled 2017-12-21 (×3): qty 1

## 2017-12-21 MED ORDER — BELLADONNA ALKALOIDS-OPIUM 16.2-60 MG RE SUPP: 1.0000 | Freq: Four times a day (QID) | RECTAL | Status: DC | PRN

## 2017-12-21 MED ORDER — CLONAZEPAM 0.5 MG PO TABS
0.5000 mg | ORAL_TABLET | Freq: Every day | ORAL | Status: DC
  Administered 2017-12-21 – 2017-12-22 (×2): 0.5 mg via ORAL
  Filled 2017-12-21 (×3): qty 1

## 2017-12-21 MED ORDER — HYDROMORPHONE HCL 1 MG/ML IJ SOLN: 0.5000 mg | INTRAMUSCULAR | Status: DC | PRN

## 2017-12-21 MED ORDER — SENNOSIDES-DOCUSATE SODIUM 8.6-50 MG PO TABS
1.0000 | ORAL_TABLET | Freq: Every evening | ORAL | Status: DC | PRN
  Administered 2017-12-22: 1 via ORAL
  Filled 2017-12-21: qty 1

## 2017-12-21 MED ORDER — ACETAMINOPHEN 325 MG PO TABS: 650.0000 mg | ORAL_TABLET | ORAL | Status: DC | PRN

## 2017-12-21 MED ORDER — QUETIAPINE FUMARATE 25 MG PO TABS
50.0000 mg | ORAL_TABLET | Freq: Every day | ORAL | Status: DC
  Administered 2017-12-21 – 2017-12-22 (×2): 50 mg via ORAL
  Filled 2017-12-21 (×3): qty 2

## 2017-12-21 NOTE — ED Notes (Signed)
Urology cart at bedside 

## 2017-12-21 NOTE — ED Notes (Signed)
Patient c/o of abd pressure and pain, foley catheter flushed, multiple clots noted. Supply chain called for urology cart.

## 2017-12-21 NOTE — ED Provider Notes (Addendum)
[facility] Emergency Department Provider Note  ____________________________________________   I have reviewed the triage vital signs and the nursing notes. Where available I have reviewed prior notes and, if possible and indicated, outside hospital notes.    HISTORY  Chief Complaint Hematuria    HPI Christopher Carlson is a 77 y.o. male with a history of urethral stricture has BPH, on the 18th of this month had a cystoscopy with ureteral dilation, bilateral retrograde pyelogram and fulguration of bleeding prostatic vessels.  Patient did well postop, they did try to remove the catheter on the 21st but had gross hematuria and apparently was replaced.  That was Thursday per the family, he is been draining hematuria since that time then around 930 last night he began to have decreased drainage, there was not much in the back.  He called 911 but when he stood up to go to the ambulance, there was a gush of fluid in the bag filled up and he felt much better.  He did have suprapubic pressure prior to that time.  This is a strong feeling that he had a clot that was blocking the catheter which is no longer present.  He denies any fever or chills dysuria.  He did have some blood coming around his Foley out of his penis when the catheter was blocked but that no longer is happening.  He does not have any abdominal pain fever vomiting.  He is having gross hematuria he would like to have his hemoglobin checked.     Past Medical History:  Diagnosis Date  . BPH (benign prostatic hyperplasia)   . CAD (coronary artery disease)   . GERD (gastroesophageal reflux disease)   . High cholesterol   . Hypothyroidism   . Myocardial infarction ([place]) 09/2016  . Nephrolithiasis   . Thyroid disease     Patient Active Problem List   Diagnosis Date Noted  . Pulmonary embolism ([place]) 02/01/2017  . Syncope 10/10/2016    Past Surgical History:  Procedure Laterality Date  . CORONARY STENT  INTERVENTION N/A 10/12/2016   Procedure: CORONARY STENT INTERVENTION;  Surgeon: Yolonda Kida, MD;  Location: [place] [facility];  Service: Cardiovascular;  Laterality: N/A;  . CYSTOSCOPY W/ RETROGRADES Bilateral 12/16/2017   Procedure: CYSTOSCOPY WITH RETROGRADE PYELOGRAM;  Surgeon: Hollice Espy, MD;  Location: [facility];  Service: Urology;  Laterality: Bilateral;  . CYSTOSCOPY WITH FULGERATION  12/16/2017   Procedure: CYSTOSCOPY WITH FULGERATION of bleeding prostate;  Surgeon: Hollice Espy, MD;  Location: [facility] OR[facility];  Service: Urology;;  . [name] WITH URETHRAL DILATATION N/A 12/16/2017   Procedure: CYSTOSCOPY WITH URETHRAL DILATATION;  Surgeon: Hollice Espy, MD;  Location: [facility];  Service: Urology;  Laterality: N/A;  . HERNIA REPAIR    . LEFT HEART CATH AND CORONARY ANGIOGRAPHY N/A 10/12/2016   Procedure: LEFT HEART CATH AND CORONARY ANGIOGRAPHY and possible pci;  Surgeon: Yolonda Kida, MD;  Location: [place] [facility];  Service: Cardiovascular;  Laterality: N/A;    Prior to Admission medications   Medication Sig Start Date End Date Taking? Authorizing Provider  aspirin EC 81 MG tablet Take 81 mg by mouth daily.    [provider]  atorvastatin (LIPITOR) 40 MG tablet Take 40 mg by mouth daily. 07/03/16   [provider]  clonazePAM (KLONOPIN) 0.5 MG tablet Take 0.5 mg by mouth at bedtime.  09/10/16   [provider]  clopidogrel (PLAVIX) 75 MG tablet Take 75 mg by mouth daily.  03/27/17 03/27/18  [provider]  finasteride (PROSCAR) 5 MG tablet Take 5 mg by mouth daily.    [provider]  HYDROcodone-acetaminophen (NORCO/VICODIN) 5-325 MG tablet Take 1-2 tablets by mouth every 6 (six) hours as needed for moderate pain. 12/16/17   Hollice Espy, MD  levothyroxine (SYNTHROID, LEVOTHROID) 112 MCG tablet Take 112 mcg by mouth daily before breakfast.  07/03/16   [provider]  nitroGLYCERIN (NITROSTAT) 0.4 MG SL  tablet Place 1 tablet (0.4 mg total) under the tongue every 5 (five) minutes as needed for chest pain. 10/13/16   Bettey Costa, MD  pantoprazole (PROTONIX) 40 MG tablet Take 40 mg by mouth daily. 07/03/16   [provider]  QUEtiapine (SEROQUEL) 50 MG tablet Take 50 mg by mouth at bedtime. 07/03/16   [provider]    Allergies Ciprofloxacin  Family History  Problem Relation Age of Onset  . Cancer Father   . Alcohol abuse Father   . Prostate cancer Neg Hx   . Bladder Cancer Neg Hx   . Kidney cancer Neg Hx     Social History Social History   Tobacco Use  . Smoking status: Former Smoker    Packs/day: 2.50    Years: 20.00    Pack years: 50.00    Types: Cigarettes  . Smokeless tobacco: Never Used  Substance Use Topics  . Alcohol use: No  . Drug use: No    Review of Systems Constitutional: No fever/chills Eyes: No visual changes. ENT: No sore throat. No stiff neck no neck pain Cardiovascular: Denies chest pain. Respiratory: Denies shortness of breath. Gastrointestinal:   no vomiting.  No diarrhea.  No constipation. Genitourinary: Negative for dysuria. Musculoskeletal: Negative lower extremity swelling Skin: Negative for rash. Neurological: Negative for severe headaches, focal weakness or numbness.   ____________________________________________   PHYSICAL EXAM:  VITAL SIGNS: ED Triage Vitals  Enc Vitals Group     BP 12/21/17 0942 (!) 155/70     Pulse Rate 12/21/17 0942 70     Resp --      Temp 12/21/17 0942 (!) 97.5 F (36.4 C)     Temp Source 12/21/17 0942 Oral     SpO2 12/21/17 0942 95 %     Weight 12/21/17 0943 185 lb (83.9 kg)     Height 12/21/17 0943 6' (1.829 m)     Head Circumference --      Peak Flow --      Pain Score 12/21/17 0943 0     Pain Loc --      Pain Edu? --      Excl. in [place]? --     Constitutional: Alert and oriented. Well appearing and in no acute distress. Eyes: Conjunctivae are normal Head: Atraumatic HEENT: No  congestion/rhinnorhea. Mucous membranes are moist.  Oropharynx non-erythematous Neck:   Nontender with no meningismus, no masses, no stridor Cardiovascular: Normal rate, regular rhythm. Grossly normal heart sounds.  Good peripheral circulation. Respiratory: Normal respiratory effort.  No retractions. Lungs CTAB. Abdominal: Soft and nontender. No distention. No guarding no rebound Back:  There is no focal tenderness or step off.  there is no midline tenderness there are no lesions noted. there is no CVA tenderness GU: Normal external male genitalia Foley in place no active bleeding noted although Foley catheter is actively draining hematuria Musculoskeletal: No lower extremity tenderness, no upper extremity tenderness. No joint effusions, no DVT signs strong distal pulses no edema Neurologic:  Normal speech and language. No gross  focal neurologic deficits are appreciated.  Skin:  Skin is warm, dry and intact. No rash noted. Psychiatric: Mood and affect are normal. Speech and behavior are normal.  ____________________________________________   LABS (all labs ordered are listed, but only abnormal results are displayed)  Labs Reviewed  URINE CULTURE  URINALYSIS, COMPLETE (UACMP) WITH MICROSCOPIC  CBC WITH DIFFERENTIAL/PLATELET  PROTIME-INR  BASIC METABOLIC PANEL  TYPE AND SCREEN    Pertinent labs  results that were available during my care of the patient were reviewed by me and considered in my medical decision making (see chart for details). ____________________________________________  EKG  I personally interpreted any EKGs ordered by me or triage  ____________________________________________  RADIOLOGY  Pertinent labs & imaging results that were available during my care of the patient were reviewed by me and considered in my medical decision making (see chart for details). If possible, patient and/or family made aware of any abnormal findings.  No results  found. ____________________________________________    PROCEDURES  Procedure(s) performed: None  Procedures  Critical Care performed: None  ____________________________________________   INITIAL IMPRESSION / ASSESSMENT AND PLAN / ED COURSE  Pertinent labs & imaging results that were available during my care of the patient were reviewed by me and considered in my medical decision making (see chart for details).  Patient with likely temporary urinary obstruction in his Foley with clot blocking the Foley egress.  However, since that time he has drained 700 cc of fluid his abdomen is benign he feels his pressure is resolved and he has no further complaints or symptoms.  We will therefore check basic blood work and talk to his urologist.  ----------------------------------------- 11:13 AM on 12/21/2017 -----------------------------------------   We flush the Foley, we got some clots out it is draining freely at this time post bladder check is negative.  Discussed with urology, Dr. Bevelyn Ngo, who agrees with management, discharge and they will follow-up as an outpatient.  I talked to the patient about removing Plavix temporarily but he is very reluctant to do so given his history of ACS and blood clots in the past.  We will therefore not take him off Plavix although he understands is a risk to this.  Hemoglobin is reassuring vital signs are reassuring he is eager to go home, drinking and eating with no difficulty.  Creatinine is somewhat slightly elevated which is likely because of postobstructive mild neuropathy, I have encouraged him to drink plenty of fluids, patient is draining well at this time we will discharge.  ----------------------------------------- 12:21 PM on 12/21/2017 -----------------------------------------  Has clotted off a few more times requiring [place] to irrigate the Foley, at this time it appears to be draining but I am very worried that if he goes home we will simply clot  again and so is the patient.  He is very reluctant to leave under the circumstances.  I have I discussed again with urology, they are very kindly coming in to reassess.  I very much appreciate consult.   ____________________________________________   FINAL CLINICAL IMPRESSION(S) / ED DIAGNOSES  Final diagnoses:  None      This chart was dictated using voice recognition software.  Despite best efforts to proofread,  errors can occur which can change meaning.      Schuyler Amor, MD 12/21/17 1000    Schuyler Amor, MD 12/21/17 1135    Schuyler Amor, MD 12/21/17 [phone]

## 2017-12-21 NOTE — ED Notes (Signed)
Urology at bedside.

## 2017-12-21 NOTE — ED Triage Notes (Signed)
Pt ems from home with bloody urine s/p prostate procedure Monday with foley placement. Pt called ems when foley obstructed by clot and would not drain. Has since resolved.

## 2017-12-21 NOTE — ED Notes (Signed)
Foley cath flushed with sterile water. Numerous blood clots removed. Cath flowing at this time. pts daughter instructed with return demo. on how to flush cath. Supplies sent with daughter.

## 2017-12-21 NOTE — H&P (Signed)
2:27 PM   Christopher Carlson [birth year] [id number]   CC: Gross hematuria  HPI: I saw Christopher Carlson in the emergency department today for gross hematuria.  Briefly, he is a 77 year old co-morbid male with history of cardiac stent placement in September 2018, as well as saddle pulmonary embolus in January 2019, on anticoagulation with aspirin and Plavix (last Plavix dose 11/22 AM) who presents with non-draining 16 French Foley catheter with significant gross hematuria and abdominal pain and pressure.  He was irrigated by the nurses in the emergency department, but continued to have pain and non-draining catheter and urology was consulted.  On 12/16/2017 he underwent cystoscopy, urethral dilation of a urethral stricture, cystoscopy, retrograde pyelograms, and fulguration of prostatic bleeding with Dr. Erlene Quan.  No bladder tumors or filling defects were noted, however the prostate was noted to be massive in size(160g on CT) and friable prostatic vessels were fulgurated.  Since that time he has had intermittent problems with hematuria and retention, his catheter was most recently replaced on 12/19/2017.  He has had multiple negative prostate biopsies by an outside urologist, and he has had appropriate decrease in his PSA on finasteride.  He denies chest pain, shortness of breath, or flank pain.  He has not eaten or drank anything today.  There are no alleviating factors.  Severity is moderate to severe.   PMH: Past Medical History:  Diagnosis Date  . BPH (benign prostatic hyperplasia)   . CAD (coronary artery disease)   . GERD (gastroesophageal reflux disease)   . High cholesterol   . Hypothyroidism   . Myocardial infarction ([place]) 09/2016  . Nephrolithiasis   . Thyroid disease     Surgical History: Past Surgical History:  Procedure Laterality Date  . CORONARY STENT INTERVENTION N/A 10/12/2016   Procedure: CORONARY STENT INTERVENTION;  Surgeon: Yolonda Kida, MD;  Location: [place] [facility];  Service: Cardiovascular;  Laterality: N/A;  . CYSTOSCOPY W/ RETROGRADES Bilateral 12/16/2017   Procedure: CYSTOSCOPY WITH RETROGRADE PYELOGRAM;  Surgeon: Hollice Espy, MD;  Location: [facility];  Service: Urology;  Laterality: Bilateral;  . CYSTOSCOPY WITH FULGERATION  12/16/2017   Procedure: CYSTOSCOPY WITH FULGERATION of bleeding prostate;  Surgeon: Hollice Espy, MD;  Location: [facility];  Service: Urology;;  . [name] WITH URETHRAL DILATATION N/A 12/16/2017   Procedure: CYSTOSCOPY WITH URETHRAL DILATATION;  Surgeon: Hollice Espy, MD;  Location: [facility];  Service: Urology;  Laterality: N/A;  . HERNIA REPAIR    . LEFT HEART CATH AND CORONARY ANGIOGRAPHY N/A 10/12/2016   Procedure: LEFT HEART CATH AND CORONARY ANGIOGRAPHY and possible pci;  Surgeon: Yolonda Kida, MD;  Location: [place] [facility];  Service: Cardiovascular;  Laterality: N/A;    Allergies:  Allergies  Allergen Reactions  . Ciprofloxacin Rash    Family History: Family History  Problem Relation Age of Onset  . Cancer Father   . Alcohol abuse Father   . Prostate cancer Neg Hx   . Bladder Cancer Neg Hx   . Kidney cancer Neg Hx     Social History:  reports that he has quit smoking. His smoking use included cigarettes. He has a 50.00 pack-year smoking history. He has never used smokeless tobacco. He reports that he does not drink alcohol or use drugs.  ROS: A 10 point review of systems was negative aside from those stated in the HPI.  Physical Exam: BP 131/61   Pulse (!) 55   Temp (!) 97.5 F (36.4 C) (Oral)  Ht 6' (1.829 m)   Wt 83.9 kg   SpO2 99%   BMI 25.09 kg/m    Constitutional:  Alert and oriented, No acute distress. Cardiovascular: Regular rate and rhythm Respiratory: Clear to auscultation bilaterally GI: Abdomen is soft, nontender, nondistended, no abdominal masses GU: No CVA tenderness, phallus without lesions,widely patent meatus 16 French Foley with dark red  urine and clots Lymph: No cervical or inguinal lymphadenopathy. Skin: No rashes, bruises or suspicious lesions. Neurologic: Grossly intact, no focal deficits, moving all 4 extremities. Psychiatric: Normal mood and affect.  Laboratory Data: Reviewed Hematocrit 42.5 from 47.5 12/10/2017 Creatinine 1.5 WBC 6.1  Procedure: The patient was prepped and draped in standard sterile fashion.  A sensor wire was advanced through his old 17 French Foley catheter and the 45 [place] Foley was removed.  A 22 French coud council tip three-way Foley was advanced over the wire into the bladder with return of bloody urine.  40cc were placed in the balloon.  The bladder was irrigated with 2 L of normal saline with return of over 100 cc of dark clot.  When no further clot could be irrigated, the patient was started on CBI.  The catheter was placed on gentle traction using foam tape.  Urine was clear to faint pink on fast CBI.  Assessment & Plan:   In summary, Christopher Carlson is a comorbid 77 year old male with history of pulmonary embolus in January 2019, and cardiac stent placement in September 2018, on anticoagulation with aspirin and Plavix, with negative hematuria work-up recently aside from massive 160g friable prostate.  He underwent cystoscopy with fulguration of prostatic bleeders on 12/16/2017, and has had ongoing problems with bleeding and retention.  We discussed plan for admission with CBI and monitoring the urine.  We discussed the increased risk of pulmonary embolus, cardiac events, and stroke by holding Plavix.  We discussed possible future options for his ongoing hematuria and BPH with incomplete emptying including prostatic artery embolization(PAE) or HOLEP in the future.  Admit to urology Continue three-way Foley on traction with fast CBI.  DO NOT remove Foley catheter. Hold Plavix NPO in case operative intervention is required tomorrow for ongoing hematuria  Billey Co, [place] [address], [place], [place] [address] [phone]

## 2017-12-22 LAB — PROTIME-INR
INR: 1.1
Prothrombin Time: 14.1 seconds (ref 11.4–15.2)

## 2017-12-22 LAB — URINE CULTURE: Culture: NO GROWTH

## 2017-12-22 LAB — BASIC METABOLIC PANEL
ANION GAP: 5 (ref 5–15)
BUN: 17 mg/dL (ref 8–23)
CALCIUM: 8.6 mg/dL — AB (ref 8.9–10.3)
CHLORIDE: 107 mmol/L (ref 98–111)
CO2: 27 mmol/L (ref 22–32)
CREATININE: 1.35 mg/dL — AB (ref 0.61–1.24)
GFR calc non Af Amer: 49 mL/min — ABNORMAL LOW (ref >60)
GFR, EST AFRICAN AMERICAN: 57 mL/min — AB (ref >60)
Glucose, Bld: 110 mg/dL — ABNORMAL HIGH (ref 70–99)
Potassium: 4.7 mmol/L (ref 3.5–5.1)
SODIUM: 139 mmol/L (ref 135–145)

## 2017-12-22 LAB — CBC
HEMATOCRIT: 37.4 % — AB (ref 39.0–52.0)
Hemoglobin: 12.1 g/dL — ABNORMAL LOW (ref 13.0–17.0)
MCH: 30.5 pg (ref 26.0–34.0)
MCHC: 32.4 g/dL (ref 30.0–36.0)
MCV: 94.2 fL (ref 80.0–100.0)
NRBC: 0 % (ref 0.0–0.2)
PLATELETS: 209 10*3/uL (ref 150–400)
RBC: 3.97 MIL/uL — AB (ref 4.22–5.81)
RDW: 12.7 % (ref 11.5–15.5)
WBC: 10.8 10*3/uL — ABNORMAL HIGH (ref 4.0–10.5)

## 2017-12-22 MED ORDER — DEXTROSE-NACL 5-0.45 % IV SOLN: INTRAVENOUS | Status: DC

## 2017-12-22 MED ORDER — TAMSULOSIN HCL 0.4 MG PO CAPS
0.4000 mg | ORAL_CAPSULE | Freq: Every day | ORAL | Status: DC
  Administered 2017-12-22 – 2017-12-23 (×3): 0.4 mg via ORAL
  Filled 2017-12-22 (×3): qty 1

## 2017-12-22 MED ORDER — SODIUM CHLORIDE 0.9 % IV SOLN
INTRAVENOUS | Status: DC
  Administered 2017-12-22 – 2017-12-23 (×2): via INTRAVENOUS

## 2017-12-22 MED ORDER — SODIUM CHLORIDE 0.45 % IV SOLN: INTRAVENOUS | Status: DC

## 2017-12-22 NOTE — Plan of Care (Signed)
Bladder irrigation slowed down by urologist. The patient has been stable. Bladder irrigation color has been a pink color. IV fluids started. Diet changed to regular and will be changed to NPO at midnight for possible OR if required tomorrow depending on CBI's. PRN pain  medication provided 1 time this morning.  Problem: Education: Goal: Knowledge of General Education information will improve Description Including pain rating scale, medication(s)/side effects and non-pharmacologic comfort measures Outcome: Progressing   Problem: Health Behavior/Discharge Planning: Goal: Ability to manage health-related needs will improve Outcome: Progressing   Problem: Clinical Measurements: Goal: Ability to maintain clinical measurements within normal limits will improve Outcome: Progressing Goal: Will remain free from infection Outcome: Progressing Goal: Diagnostic test results will improve Outcome: Progressing Goal: Respiratory complications will improve Outcome: Progressing Goal: Cardiovascular complication will be avoided Outcome: Progressing   Problem: Activity: Goal: Risk for activity intolerance will decrease Outcome: Progressing   Problem: Nutrition: Goal: Adequate nutrition will be maintained Outcome: Progressing   Problem: Coping: Goal: Level of anxiety will decrease Outcome: Progressing   Problem: Elimination: Goal: Will not experience complications related to bowel motility Outcome: Progressing Goal: Will not experience complications related to urinary retention Outcome: Progressing   Problem: Pain Managment: Goal: General experience of comfort will improve Outcome: Progressing   Problem: Safety: Goal: Ability to remain free from injury will improve Outcome: Progressing   Problem: Skin Integrity: Goal: Risk for impaired skin integrity will decrease Outcome: Progressing

## 2017-12-22 NOTE — Progress Notes (Addendum)
   Subjective No acute events overnight, catheter draining well Pain controlled, afebrile  Physical Exam: BP 107/68 (BP Location: Left Arm)   Pulse 96   Temp 98.3 F (36.8 C) (Oral)   Resp 18   Ht 6' (1.829 m)   Wt 83.9 kg   SpO2 98%   BMI 25.09 kg/m    Constitutional:  Alert and oriented, No acute distress. Respiratory: Normal respiratory effort, no increased work of breathing. GI: Abdomen is soft, non-tender, non-distended GU: No CVA tenderness, phallus without lesions, widely patent meatus Drains: 22 French three-way Foley with clear urine, on traction, CBI fast  Laboratory Data: Reviewed Hematocrit 37.4 (42.5)  Catheter was irrigated with a liter of normal saline.  Less than 10 cc clot.  Catheter taken off of traction, and CBI slowed to slow-medium drip, urine remained light watermelon colored.  Assessment & Plan:   In summary, Christopher Carlson is a co-morbid 77 year old male with history of pulmonary embolus in January 2019, and cardiac stent placement in September 2018, on anticoagulation with aspirin and Plavix(last dose 11/22 AM), with negative hematuria work-up recently aside from massive 160g friable prostate.  He underwent cystoscopy with fulguration of prostatic bleeding on 12/16/2017 with Dr. Hollice Espy, and has had ongoing problems with bleeding and retention.    I discussed options at length today with the patient and his daughter.  Will monitor urine this morning to see if remains clear to pink without CBI as Plavix wears off.  If remains clear, consider discharge tomorrow with or without Foley catheter.  If urine remains bloody or still requiring CBI, may require intervention with cystoscopy, clot evacuation, fulguration/possible TURP today versus tomorrow.  Long-term, he is likely best served with a HOLEP or prostatic artery embolization secondary to his 160g gland.  Continue to hold Plavix Continue three-way Foley with slow CBI.  DO NOT remove Foley  catheter. NPO in case operative intervention is required for ongoing hematuria, will recheck in 1 to 2 hours  ADDENDUM: Urine clear to faint pink on slow CBI. [place] for diet. Plan to stop CBI tomorrow AM and likely remove foley and void trial.  Billey Co, MD  12/22/2017

## 2017-12-22 NOTE — Care Management Obs Status (Signed)
[place] NOTIFICATION   Patient Details  Name: Christopher Carlson MRN: [id number] Date of Birth: February 03, 1940   Medicare Observation Status Notification Given:  Yes    Evangelyn Crouse A Charna Neeb, RN 12/22/2017, 9:42 AM

## 2017-12-22 NOTE — Care Management Note (Signed)
Case Management Note  Patient Details  Name: Christopher Carlson MRN: [id number] Date of Birth: 1940-06-20  Subjective/Objective:   Patient admitted to [facility] under observation status for gross heamturia. RNCM consulted on patient to provide MOON letter and complete assessment. Patient lives in an [place] behind his daughters house. Completely independent with activities of daily living at baseline. Uses no DME. CBI currently in place. PCP is [facility]. Uses [employer] and obtains medications without issue.                  Action/Plan: RNCM to continue to follow for any needs.   Expected Discharge Date:  12/26/17               Expected Discharge Plan:     In-House Referral:     Discharge planning Services     Post Acute Care Choice:    Choice offered to:     DME Arranged:    DME Agency:     HH Arranged:    HH Agency:     Status of Service:     If discussed at [employer], dates discussed:    Additional Comments:  Latanya Maudlin, RN 12/22/2017, 9:52 AM

## 2017-12-23 DIAGNOSIS — R339 Retention of urine, unspecified: Secondary | ICD-10-CM | POA: Diagnosis not present

## 2017-12-23 DIAGNOSIS — R319 Hematuria, unspecified: Secondary | ICD-10-CM | POA: Diagnosis not present

## 2017-12-23 LAB — CBC
HCT: 32.8 % — ABNORMAL LOW (ref 39.0–52.0)
Hemoglobin: 10.8 g/dL — ABNORMAL LOW (ref 13.0–17.0)
MCH: 30.8 pg (ref 26.0–34.0)
MCHC: 32.9 g/dL (ref 30.0–36.0)
MCV: 93.4 fL (ref 80.0–100.0)
PLATELETS: 206 10*3/uL (ref 150–400)
RBC: 3.51 MIL/uL — ABNORMAL LOW (ref 4.22–5.81)
RDW: 12.8 % (ref 11.5–15.5)
WBC: 7.5 10*3/uL (ref 4.0–10.5)
nRBC: 0 % (ref 0.0–0.2)

## 2017-12-23 LAB — TYPE AND SCREEN
ABO/RH(D): B POS
ANTIBODY SCREEN: NEGATIVE

## 2017-12-23 MED ORDER — ATORVASTATIN CALCIUM 20 MG PO TABS: 40.0000 mg | ORAL_TABLET | Freq: Every day | ORAL | Status: DC

## 2017-12-23 NOTE — Progress Notes (Signed)
Urology   Subjective: CBI was slowed yesterday and remained slow throughout the day and night until early this morning when catheter became obstructed by small clot and he voided around the Foley.  Since then, the rate was increased dramatically.  Early this a.m. CBI was turned off and remained off for about an hour and a half at which time his urine remained light red without clots.  Voiding trial performed.  Anti-infectives: Anti-infectives (From admission, onward)   Start     Dose/Rate Route Frequency Ordered Stop   12/21/17 1630  cefTRIAXone (ROCEPHIN) 1 g in sodium chloride 0.9 % 100 mL IVPB     1 g 200 mL/hr over 30 Minutes Intravenous  Once 12/21/17 1554 12/21/17 1836      Current Facility-Administered Medications  Medication Dose Route Frequency Provider Last Rate Last Dose  . 0.9 %  sodium chloride infusion   Intravenous Continuous Billey Co, MD 50 mL/hr at 12/23/17 1104    . acetaminophen (TYLENOL) tablet 650 mg  650 mg Oral Q4H PRN Billey Co, MD      . aspirin EC tablet 81 mg  81 mg Oral Daily Billey Co, MD   81 mg at 12/23/17 1058  . [START ON 12/24/2017] atorvastatin (LIPITOR) tablet 40 mg  40 mg Oral QHS Billey Co, MD      . clonazePAM [facility]bye [place]) tablet 0.5 mg  0.5 mg Oral QHS Billey Co, MD   0.5 mg at 12/22/17 2105  . finasteride (PROSCAR) tablet 5 mg  5 mg Oral Daily Billey Co, MD   5 mg at 12/23/17 1058  . HYDROcodone-acetaminophen (NORCO/VICODIN) 5-325 MG per tablet 1-2 tablet  1-2 tablet Oral Q6H PRN Billey Co, MD   2 tablet at 12/22/17 2104  . HYDROmorphone (DILAUDID) injection 0.5-1 mg  0.5-1 mg Intravenous Q2H PRN Billey Co, MD      . levothyroxine (SYNTHROID, LEVOTHROID) tablet 112 mcg  112 mcg Oral QAC breakfast Billey Co, MD   112 mcg at 12/23/17 3220  . nitroGLYCERIN (NITROSTAT) SL tablet 0.4 mg  0.4 mg Sublingual Q5 min PRN Billey Co, MD      . ondansetron [facility]) injection 4 mg  4 mg  Intravenous Q4H PRN Billey Co, MD      . opium-belladonna (B&O SUPPRETTES) 16.2-60 MG suppository 1 suppository  1 suppository Rectal Q6H PRN Billey Co, MD      . pantoprazole (PROTONIX) EC tablet 40 mg  40 mg Oral Daily Billey Co, MD   40 mg at 12/23/17 1058  . QUEtiapine (SEROQUEL) tablet 50 mg  50 mg Oral QHS Billey Co, MD   50 mg at 12/22/17 2104  . senna-docusate (Senokot-S) tablet 1 tablet  1 tablet Oral QHS PRN Billey Co, MD   1 tablet at 12/22/17 0912  . tamsulosin (FLOMAX) capsule 0.4 mg  0.4 mg Oral QPC supper Billey Co, MD   0.4 mg at 12/22/17 1739     Objective: Vital signs in last 24 hours: Temp:  [97.8 F (36.6 C)-98 F (36.7 C)] 97.9 F (36.6 C) (11/25 1123) Pulse Rate:  [73-95] 95 (11/25 1400) Resp:  [12-17] 17 (11/25 1123) BP: (99-110)/(54-77) 110/77 (11/25 1400) SpO2:  [95 %-100 %] 100 % (11/25 1400)  Intake/Output from previous day: 11/24 0701 - 11/25 0700 In: 14963.5 [P.O.:395; I.V.:1118.5] Out: 24075 [Urine:24075] Intake/Output this shift: Total I/O In: 253.3 [I.V.:253.3] Out: 200 [Urine:200]  Physical Exam  Constitutional: He is oriented to person, place, and time. He appears well-developed.  HENT:  Head: Normocephalic and atraumatic.  Cardiovascular: Normal rate.  Pulmonary/Chest: Effort normal. No respiratory distress.  Abdominal: Soft. He exhibits no distension.  Genitourinary: Penis normal.  Genitourinary Comments: Foley catheter in place  Neurological: He is alert and oriented to person, place, and time.  Skin: Skin is warm and dry.  Psychiatric: He has a normal mood and affect.  Vitals reviewed.   Lab Results:  Recent Labs    12/22/17 0612 12/23/17 0417  WBC 10.8* 7.5  HGB 12.1* 10.8*  HCT 37.4* 32.8*  PLT 209 206   BMET Recent Labs    12/21/17 1005 12/22/17 0612  NA 139 139  K 4.0 4.7  CL 103 107  CO2 26 27  GLUCOSE 101* 110*  BUN 22 17  CREATININE 1.50* 1.35*  CALCIUM 9.1 8.6*    PT/INR Recent Labs    12/21/17 1005 12/22/17 0612  LABPROT 13.8 14.1  INR 1.07 1.10     Assessment/ Plan: 77 year old male with refractory prostatic bleeding requiring admission for CBI.  This morning, his hematuria appears to be improving.  CBI was left off for several hours and ultimately underwent a voiding trial which time he passed a small clot.  He has had a downward trend in his hemoglobin today down to 10.8 which is likely multifactorial secondary to acute blood loss anemia and delusional.  Watch the patient closely today and keep him overnight for repeat CBC in the morning.  Plan to BladderScan the patient after he voids and replace Foley as needed.  If he continues to have refractory gross blood, will consider proceeding to the operating room for repeat fulguration versus consideration of transfer for consideration of prostatic embolization.    LOS: 0 days    Hollice Espy 12/23/2017

## 2017-12-24 DIAGNOSIS — R319 Hematuria, unspecified: Secondary | ICD-10-CM | POA: Diagnosis not present

## 2017-12-24 DIAGNOSIS — R339 Retention of urine, unspecified: Secondary | ICD-10-CM | POA: Diagnosis not present

## 2017-12-24 LAB — CBC
HCT: 31 % — ABNORMAL LOW (ref 39.0–52.0)
Hemoglobin: 10.2 g/dL — ABNORMAL LOW (ref 13.0–17.0)
MCH: 30.5 pg (ref 26.0–34.0)
MCHC: 32.9 g/dL (ref 30.0–36.0)
MCV: 92.8 fL (ref 80.0–100.0)
PLATELETS: 209 10*3/uL (ref 150–400)
RBC: 3.34 MIL/uL — ABNORMAL LOW (ref 4.22–5.81)
RDW: 12.7 % (ref 11.5–15.5)
WBC: 8.4 10*3/uL (ref 4.0–10.5)
nRBC: 0 % (ref 0.0–0.2)

## 2017-12-24 NOTE — Discharge Summary (Signed)
Date of admission: 12/21/2017  Date of discharge: 12/24/2017  Admission diagnosis: Gross hematuria, acute kidney injury  Discharge diagnosis: Same as above, acute blood loss anemia  Secondary diagnoses:  Patient Active Problem List   Diagnosis Date Noted  . Hematuria, gross 12/21/2017  . Pulmonary embolism ([place]) 02/01/2017  . Syncope 10/10/2016    History and Physical: For full details, please see admission history and physical. Briefly, Christopher Carlson is a 77 y.o. year old patient with gross hematuria admitted with clot retention.  Hospital Course: In the emergency room, the catheter was upsized to three-way hematuria catheter and he was hand irrigated.  He was then subsequently placed on CBI which was titrated off over the course of several days.  His hemoglobin was followed and trended downwards and ultimately stabilized but never necessitating blood transfusion.  He remained hemodynamically stable.  Ultimately, his Foley catheter was able to be removed and he was able to successfully void multiple times with gradually improving hematuria to the point where his urine was light pink without clots on the day of discharge.  he had met discharge criteria: was eating a regular diet, was up and ambulating independently,  pain was well controlled, was voiding without a catheter, and was ready to for discharge.  Physical Exam  Constitutional: He is oriented to person, place, and time and well-developed, well-nourished, and in no distress.  HENT:  Head: Normocephalic and atraumatic.  Cardiovascular: Regular rhythm.  Abdominal: Soft. He exhibits no distension.  Genitourinary: Penis normal.  Neurological: He is alert and oriented to person, place, and time.  Skin: Skin is warm and dry.  Vitals reviewed.    Laboratory values:  Recent Labs    12/22/17 0612 12/23/17 0417 12/24/17 0559  WBC 10.8* 7.5 8.4  HGB 12.1* 10.8* 10.2*  HCT 37.4* 32.8* 31.0*   Recent Labs    12/22/17 0612   NA 139  K 4.7  CL 107  CO2 27  GLUCOSE 110*  BUN 17  CREATININE 1.35*  CALCIUM 8.6*   Recent Labs    12/22/17 0612  INR 1.10   No results for input(s): LABURIN in the last 72 hours. Results for orders placed or performed during the hospital encounter of 12/21/17  Urine culture     Status: None   Collection Time: 12/21/17 10:05 AM  Result Value Ref Range Status   Specimen Description   Final    URINE, RANDOM Performed at [facility], [address], [place], [place] [address]    Special Requests   Final    NONE Performed at [facility], [address] [place], [place] [address]    Culture   Final    NO GROWTH Performed at [facility], [place] [address], [place], [place] [address]    Report Status 12/22/2017 FINAL  Final    Disposition: Home  Discharge instruction: If you can't urinate, please call our office of go to the emergency room.  Continue to plavix until your urine has been clear for a few days.   Please continue to take your baby aspirin.  Discharge medications:  Allergies as of 12/24/2017      Reactions   Ciprofloxacin Rash      Medication List    TAKE these medications   aspirin EC 81 MG tablet Take 81 mg by mouth daily.   atorvastatin 40 MG tablet Commonly known as:  LIPITOR Take 40 mg by mouth daily.   clonazePAM 0.5 MG tablet  Commonly known as:  KLONOPIN Take 0.5 mg by mouth at bedtime.   clopidogrel 75 MG tablet Commonly known as:  PLAVIX Take 75 mg by mouth daily.   finasteride 5 MG tablet Commonly known as:  PROSCAR Take 5 mg by mouth daily.   HYDROcodone-acetaminophen 5-325 MG tablet Commonly known as:  NORCO/VICODIN Take 1-2 tablets by mouth every 6 (six) hours as needed for moderate pain.   levothyroxine 112 MCG tablet Commonly known as:  SYNTHROID, LEVOTHROID Take 112 mcg by mouth daily before breakfast.   nitroGLYCERIN 0.4 MG SL tablet Commonly known as:  NITROSTAT Place 1 tablet (0.4 mg  total) under the tongue every 5 (five) minutes as needed for chest pain.   pantoprazole 40 MG tablet Commonly known as:  PROTONIX Take 40 mg by mouth daily.   QUEtiapine 50 MG tablet Commonly known as:  SEROQUEL Take 50 mg by mouth at bedtime.       Followup:  Follow-up Information    Hedrick, James, MD. Schedule an appointment as soon as possible for a visit in 2 days.   Specialty:  Family Medicine Contact information: [address] [facility] [place] [address] [phone]           

## 2017-12-24 NOTE — Progress Notes (Signed)
Pt discharged per MD order. IV removed. Discharge instructions reviewed with pt. All pts questions answered to satisfaction. Pt taken to car in wheelchair by staff.

## 2017-12-24 NOTE — Discharge Instructions (Signed)
If you can't urinate, please call our office of go to the emergency room.  Continue to plavix until your urine has been clear for a few days.   Please continue to take your baby aspirin.

## 2017-12-24 NOTE — Progress Notes (Signed)
Patient refused HS medications, stating that he didn't want them at that time, because he didn't want to go to sleep and then wake up and not be able to "pee, with all these clots coming out"; agreed that he would call when he wanted them; offered HS medications at midnight, patient refused; urinating red urine without clots at this time; patient then asking for 1/2 the medications that were due at HS; instructed patient that nursing could not just give him half the dose of his HS medications, nor give them at 0300; voiced understanding; "I don't think I'll be able to go to sleep"; will limit interruptions until 0600. Christopher Carlson K 3:09 AM 12/24/2017

## 2017-12-25 ENCOUNTER — Encounter: Payer: Self-pay | Admitting: Urology

## 2017-12-25 ENCOUNTER — Ambulatory Visit (INDEPENDENT_AMBULATORY_CARE_PROVIDER_SITE_OTHER): Payer: Medicare HMO | Admitting: Urology

## 2017-12-25 VITALS — BP 131/80 | HR 88 | Ht 72.0 in | Wt 183.6 lb

## 2017-12-25 DIAGNOSIS — N138 Other obstructive and reflux uropathy: Secondary | ICD-10-CM

## 2017-12-25 DIAGNOSIS — N401 Enlarged prostate with lower urinary tract symptoms: Secondary | ICD-10-CM | POA: Diagnosis not present

## 2017-12-25 DIAGNOSIS — Z87898 Personal history of other specified conditions: Secondary | ICD-10-CM

## 2017-12-25 LAB — BLADDER SCAN AMB NON-IMAGING

## 2017-12-25 MED ORDER — TAMSULOSIN HCL 0.4 MG PO CAPS: 0.4000 mg | ORAL_CAPSULE | Freq: Every day | ORAL | 3 refills | Status: AC

## 2017-12-25 NOTE — Progress Notes (Addendum)
12/25/2017 6:22 PM   Christopher Carlson August 21, 1940 [id number]  Referring provider: [place] Pink, MD [address] [facility] [place], [place] [address]  Chief Complaint  Patient presents with  . foley removal    HPI: Christopher Carlson is a 77 yo M with a history of gross hematuria, BPH with urinary obstruction, and elevated PSA who returns today for a follow-up from hospitalization with his daughter, [name].   He underwent cysto, urethral dilation, RTG's and fulguration of bleeding prostatic vessels on 12/16/2017 with Dr. Erlene Quan.  He had the Foley removed on 12/19/2017, but it had to be replaced that afternoon due to inability to void.  PVR was 600 mL with red urine.    He was admitted to the hospital on 12/21/2017 for CBI after he was found to be in clot retention with his Foley.  He was discharged on 12/24/2017.  He was voiding pink tinged urine.  Foley was removed prior to discharge.  Pt reports of frequent urination, difficulties to postpone urination, nocturia, leakage of urine, intermittent urine stream, strain to urinate, and gross hematuria.  Pt reports being on aspirin and blood thinner medications.   Pt reports of a small blood clot when urinating. Pt voided 48 oz of urine in a white cup without difficulty and indicates a light pink color with passing 3 little clots 1 day ago.     His PVR is 282 mL.     PMH: Past Medical History:  Diagnosis Date  . BPH (benign prostatic hyperplasia)   . CAD (coronary artery disease)   . GERD (gastroesophageal reflux disease)   . High cholesterol   . Hypothyroidism   . Myocardial infarction ([place]) 09/2016  . Nephrolithiasis   . Thyroid disease     Surgical History: Past Surgical History:  Procedure Laterality Date  . CORONARY STENT INTERVENTION N/A 10/12/2016   Procedure: CORONARY STENT INTERVENTION;  Surgeon: Yolonda Kida, MD;  Location: [place] [facility];  Service: Cardiovascular;  Laterality: N/A;  . CYSTOSCOPY  W/ RETROGRADES Bilateral 12/16/2017   Procedure: CYSTOSCOPY WITH RETROGRADE PYELOGRAM;  Surgeon: Hollice Espy, MD;  Location: [facility];  Service: Urology;  Laterality: Bilateral;  . CYSTOSCOPY WITH FULGERATION  12/16/2017   Procedure: CYSTOSCOPY WITH FULGERATION of bleeding prostate;  Surgeon: Hollice Espy, MD;  Location: [facility];  Service: Urology;;  . [name] WITH URETHRAL DILATATION N/A 12/16/2017   Procedure: CYSTOSCOPY WITH URETHRAL DILATATION;  Surgeon: Hollice Espy, MD;  Location: [facility];  Service: Urology;  Laterality: N/A;  . HERNIA REPAIR    . LEFT HEART CATH AND CORONARY ANGIOGRAPHY N/A 10/12/2016   Procedure: LEFT HEART CATH AND CORONARY ANGIOGRAPHY and possible pci;  Surgeon: Yolonda Kida, MD;  Location: [place] [facility];  Service: Cardiovascular;  Laterality: N/A;    Home Medications:  Allergies as of 12/25/2017      Reactions   Ciprofloxacin Rash      Medication List        Accurate as of 12/25/17 11:59 PM. Always use your most recent med list.          aspirin EC 81 MG tablet Take 81 mg by mouth daily.   atorvastatin 40 MG tablet Commonly known as:  LIPITOR Take 40 mg by mouth daily.   clonazePAM 0.5 MG tablet Commonly known as:  KLONOPIN Take 0.5 mg by mouth at bedtime.   clopidogrel 75 MG tablet Commonly known as:  PLAVIX Take 75 mg by mouth daily.  finasteride 5 MG tablet Commonly known as:  PROSCAR Take 5 mg by mouth daily.   HYDROcodone-acetaminophen 5-325 MG tablet Commonly known as:  NORCO/VICODIN Take 1-2 tablets by mouth every 6 (six) hours as needed for moderate pain.   levothyroxine 112 MCG tablet Commonly known as:  SYNTHROID, LEVOTHROID Take 112 mcg by mouth daily before breakfast.   nitroGLYCERIN 0.4 MG SL tablet Commonly known as:  NITROSTAT Place 1 tablet (0.4 mg total) under the tongue every 5 (five) minutes as needed for chest pain.   pantoprazole 40 MG tablet Commonly known as:  PROTONIX Take 40 mg  by mouth daily.   QUEtiapine 50 MG tablet Commonly known as:  SEROQUEL Take 50 mg by mouth at bedtime.   tamsulosin 0.4 MG Caps capsule Commonly known as:  FLOMAX Take 1 capsule (0.4 mg total) by mouth daily.       Allergies:  Allergies  Allergen Reactions  . Ciprofloxacin Rash    Family History: Family History  Problem Relation Age of Onset  . Cancer Father   . Alcohol abuse Father   . Prostate cancer Neg Hx   . Bladder Cancer Neg Hx   . Kidney cancer Neg Hx     Social History:  reports that he has quit smoking. His smoking use included cigarettes. He has a 50.00 pack-year smoking history. He has never used smokeless tobacco. He reports that he does not drink alcohol or use drugs.  ROS: UROLOGY Frequent Urination?: Yes Hard to postpone urination?: Yes Burning/pain with urination?: No Get up at night to urinate?: Yes Leakage of urine?: Yes Urine stream starts and stops?: Yes Trouble starting stream?: Yes Do you have to strain to urinate?: Yes Blood in urine?: Yes Urinary tract infection?: No Sexually transmitted disease?: No Injury to kidneys or bladder?: No Painful intercourse?: No Weak stream?: No Erection problems?: No Penile pain?: No  Gastrointestinal Nausea?: No Vomiting?: No Indigestion/heartburn?: No Diarrhea?: No Constipation?: Yes  Constitutional Fever: No Night sweats?: No Weight loss?: No Fatigue?: No  Skin Skin rash/lesions?: No Itching?: No  Eyes Blurred vision?: No Double vision?: No  Ears/Nose/Throat Sore throat?: No Sinus problems?: No  Hematologic/Lymphatic Swollen glands?: No Easy bruising?: Yes  Cardiovascular Leg swelling?: No Chest pain?: No  Respiratory Cough?: No Shortness of breath?: No  Endocrine Excessive thirst?: No  Musculoskeletal Back pain?: No Joint pain?: No  Neurological Headaches?: No Dizziness?: No  Psychologic Depression?: No Anxiety?: No  Physical Exam: BP 131/80 (BP Location:  Left Arm, Patient Position: Sitting, Cuff Size: Normal)   Pulse 88   Ht 6' (1.829 m)   Wt 183 lb 9.6 oz (83.3 kg)   BMI 24.90 kg/m   Constitutional: Well nourished. Alert and oriented, No acute distress. HEENT: [place] AT, moist mucus membranes. Trachea midline, no masses. Cardiovascular: No clubbing, cyanosis, or edema. Respiratory: Normal respiratory effort, no increased work of breathing. Testicles are located scrotally bilaterally. No masses are appreciated in the testicles. Left and right epididymis are normal. Skin: No rashes, bruises or suspicious lesions. Neurologic: Grossly intact, no focal deficits, moving all 4 extremities. Psychiatric: Normal mood and affect.    Laboratory Data: Lab Results  Component Value Date   WBC 8.4 12/24/2017   HGB 10.2 (L) 12/24/2017   HCT 31.0 (L) 12/24/2017   MCV 92.8 12/24/2017   PLT 209 12/24/2017    Lab Results  Component Value Date   CREATININE 1.35 (H) 12/22/2017    No results found for: PSA  No results found  for: TESTOSTERONE  No results found for: HGBA1C  Urinalysis    Component Value Date/Time   COLORURINE RED (A) 12/21/2017 1005   APPEARANCEUR CLOUDY (A) 12/21/2017 1005   APPEARANCEUR Cloudy (A) 12/03/2017 0916   LABSPEC 1.018 12/21/2017 1005   PHURINE  12/21/2017 1005    TEST NOT REPORTED DUE TO COLOR INTERFERENCE OF URINE PIGMENT   GLUCOSEU (A) 12/21/2017 1005    TEST NOT REPORTED DUE TO COLOR INTERFERENCE OF URINE PIGMENT   HGBUR (A) 12/21/2017 1005    TEST NOT REPORTED DUE TO COLOR INTERFERENCE OF URINE PIGMENT   BILIRUBINUR (A) 12/21/2017 1005    TEST NOT REPORTED DUE TO COLOR INTERFERENCE OF URINE PIGMENT   BILIRUBINUR Negative 12/03/2017 0916   KETONESUR (A) 12/21/2017 1005    TEST NOT REPORTED DUE TO COLOR INTERFERENCE OF URINE PIGMENT   PROTEINUR (A) 12/21/2017 1005    TEST NOT REPORTED DUE TO COLOR INTERFERENCE OF URINE PIGMENT   NITRITE (A) 12/21/2017 1005    TEST NOT REPORTED DUE TO COLOR INTERFERENCE  OF URINE PIGMENT   LEUKOCYTESUR (A) 12/21/2017 1005    TEST NOT REPORTED DUE TO COLOR INTERFERENCE OF URINE PIGMENT   LEUKOCYTESUR Trace (A) 12/03/2017 0916    Lab Results  Component Value Date   LABMICR See below: 12/03/2017   WBCUA 0-5 12/03/2017   RBCUA >30 (H) 12/03/2017   LABEPIT 0-10 12/03/2017   MUCUS Present (A) 12/03/2017   BACTERIA Moderate (A) 12/03/2017    Pertinent Imaging:  Results for orders placed during the hospital encounter of 07/13/17  CT Renal Stone Study   Narrative CLINICAL DATA:  Acute hematuria with clots today. Patient on anticoagulation medicine.  EXAM: CT ABDOMEN AND PELVIS WITHOUT CONTRAST  TECHNIQUE: Multidetector CT imaging of the abdomen and pelvis was performed following the standard protocol without IV contrast.  COMPARISON:  CT 02/01/2017  FINDINGS: Lower chest: Lung bases are clear.  Hepatobiliary: No focal hepatic lesion. No biliary duct dilatation. Gallbladder is normal. Common bile duct is normal.  Pancreas: Pancreas is normal. No ductal dilatation. No pancreatic inflammation.  Spleen: Normal spleen  Adrenals/urinary tract: Adrenal glands normal. 3 mm nonobstructing calculus in lower pole of the RIGHT kidney. 1 mm nonobstructing calculus lower pole LEFT kidney. No ureterolithiasis.  No bladder calculi.  bladder is elevated by a markedly enlarged prostate gland measuring normal 6.9 by 6.1 x 7.3 cm (volume = 160 cm^3).  Stomach/Bowel: A stomach, small-bowel appendix cecum normal. The colon and rectosigmoid colon normal. There multiple diverticular of the the the sigmoid colon without evidence of acute inflammation.  Vascular/Lymphatic: Abdominal aorta is normal caliber with atherosclerotic calcification. There is no retroperitoneal or periportal lymphadenopathy. No pelvic lymphadenopathy.  Reproductive: Prostate gland is markedly enlarged with a calculated volume of 160 cc  Other: No free fluid.  Musculoskeletal:  A  IMPRESSION: 1. Bilateral small nonobstructing renal calculi. 2. No ureterolithiasis or obstructive uropathy. 3. A markedly enlarged prostate gland elevates the bladder. 4. Sigmoid diverticulosis without evidence diverticulitis.   Electronically Signed   By: Suzy Bouchard M.D.   On: 07/13/2017 16:38    Results for FIDEL, CAGGIANO (MRN [id number]) as of 12/25/2017 09:53  Ref. Range 12/25/2017 09:40  Scan Result Unknown 251ml   Assessment & Plan:    1. Gross Hematuria  -See below  2. BPH with urinary symptoms -Massive prostamegaly chronically on finasteride -Possibly the source of gross hematuria  -Refer to Intervention radiologist if prostate does ot have blood supply then it will  die -Pt was prescribed Flomax.   Return for refer to [facility] for prostate artery embolizaion.  Zara Council, PA-C  [facility] [address] [place], [place] [address] [phone]  I, Lucas Mallow, am acting as a Education administrator for [employer],  I have reviewed the above documentation for accuracy and completeness, and I agree with the above.    Zara Council, PA-C

## 2017-12-30 DIAGNOSIS — G4700 Insomnia, unspecified: Secondary | ICD-10-CM | POA: Diagnosis not present

## 2017-12-30 DIAGNOSIS — Z09 Encounter for follow-up examination after completed treatment for conditions other than malignant neoplasm: Secondary | ICD-10-CM | POA: Diagnosis not present

## 2017-12-30 DIAGNOSIS — R310 Gross hematuria: Secondary | ICD-10-CM | POA: Diagnosis not present

## 2017-12-30 DIAGNOSIS — N401 Enlarged prostate with lower urinary tract symptoms: Secondary | ICD-10-CM | POA: Diagnosis not present

## 2017-12-30 DIAGNOSIS — N138 Other obstructive and reflux uropathy: Secondary | ICD-10-CM | POA: Diagnosis not present

## 2017-12-31 DIAGNOSIS — C678 Malignant neoplasm of overlapping sites of bladder: Secondary | ICD-10-CM

## 2018-01-02 ENCOUNTER — Encounter: Payer: Self-pay | Admitting: Urology

## 2018-01-15 DIAGNOSIS — R350 Frequency of micturition: Secondary | ICD-10-CM | POA: Diagnosis not present

## 2018-02-14 ENCOUNTER — Ambulatory Visit: Payer: Medicare HMO | Admitting: Urology

## 2018-02-27 DIAGNOSIS — R21 Rash and other nonspecific skin eruption: Secondary | ICD-10-CM | POA: Diagnosis not present

## 2018-03-27 DIAGNOSIS — R300 Dysuria: Secondary | ICD-10-CM | POA: Diagnosis not present

## 2018-03-27 DIAGNOSIS — N390 Urinary tract infection, site not specified: Secondary | ICD-10-CM | POA: Diagnosis not present

## 2018-04-04 ENCOUNTER — Other Ambulatory Visit: Payer: Self-pay

## 2018-04-04 ENCOUNTER — Emergency Department: Payer: Medicare HMO

## 2018-04-04 ENCOUNTER — Inpatient Hospital Stay
Admission: EM | Admit: 2018-04-04 | Discharge: 2018-04-07 | DRG: 311 | Disposition: A | Discharge status: Home / Self Care | Payer: Medicare HMO | Attending: Internal Medicine | Admitting: Internal Medicine

## 2018-04-04 ENCOUNTER — Encounter: Payer: Self-pay | Admitting: *Deleted

## 2018-04-04 DIAGNOSIS — Z7902 Long term (current) use of antithrombotics/antiplatelets: Secondary | ICD-10-CM | POA: Diagnosis not present

## 2018-04-04 DIAGNOSIS — G4700 Insomnia, unspecified: Secondary | ICD-10-CM | POA: Diagnosis present

## 2018-04-04 DIAGNOSIS — Z809 Family history of malignant neoplasm, unspecified: Secondary | ICD-10-CM | POA: Diagnosis not present

## 2018-04-04 DIAGNOSIS — N189 Chronic kidney disease, unspecified: Secondary | ICD-10-CM | POA: Diagnosis present

## 2018-04-04 DIAGNOSIS — Z79899 Other long term (current) drug therapy: Secondary | ICD-10-CM | POA: Diagnosis not present

## 2018-04-04 DIAGNOSIS — Z881 Allergy status to other antibiotic agents status: Secondary | ICD-10-CM

## 2018-04-04 DIAGNOSIS — Z7982 Long term (current) use of aspirin: Secondary | ICD-10-CM

## 2018-04-04 DIAGNOSIS — K219 Gastro-esophageal reflux disease without esophagitis: Secondary | ICD-10-CM | POA: Diagnosis present

## 2018-04-04 DIAGNOSIS — R778 Other specified abnormalities of plasma proteins: Secondary | ICD-10-CM

## 2018-04-04 DIAGNOSIS — Z955 Presence of coronary angioplasty implant and graft: Secondary | ICD-10-CM | POA: Diagnosis not present

## 2018-04-04 DIAGNOSIS — I2510 Atherosclerotic heart disease of native coronary artery without angina pectoris: Secondary | ICD-10-CM | POA: Diagnosis present

## 2018-04-04 DIAGNOSIS — I252 Old myocardial infarction: Secondary | ICD-10-CM | POA: Diagnosis not present

## 2018-04-04 DIAGNOSIS — R4182 Altered mental status, unspecified: Secondary | ICD-10-CM | POA: Diagnosis not present

## 2018-04-04 DIAGNOSIS — R4020 Unspecified coma: Secondary | ICD-10-CM | POA: Diagnosis not present

## 2018-04-04 DIAGNOSIS — R21 Rash and other nonspecific skin eruption: Secondary | ICD-10-CM | POA: Diagnosis not present

## 2018-04-04 DIAGNOSIS — Z87440 Personal history of urinary (tract) infections: Secondary | ICD-10-CM | POA: Diagnosis not present

## 2018-04-04 DIAGNOSIS — Z87891 Personal history of nicotine dependence: Secondary | ICD-10-CM

## 2018-04-04 DIAGNOSIS — Z79890 Hormone replacement therapy (postmenopausal): Secondary | ICD-10-CM | POA: Diagnosis not present

## 2018-04-04 DIAGNOSIS — E7800 Pure hypercholesterolemia, unspecified: Secondary | ICD-10-CM | POA: Diagnosis present

## 2018-04-04 DIAGNOSIS — I255 Ischemic cardiomyopathy: Secondary | ICD-10-CM | POA: Diagnosis not present

## 2018-04-04 DIAGNOSIS — F4310 Post-traumatic stress disorder, unspecified: Secondary | ICD-10-CM | POA: Diagnosis not present

## 2018-04-04 DIAGNOSIS — Z86718 Personal history of other venous thrombosis and embolism: Secondary | ICD-10-CM | POA: Diagnosis not present

## 2018-04-04 DIAGNOSIS — R7989 Other specified abnormal findings of blood chemistry: Secondary | ICD-10-CM | POA: Diagnosis not present

## 2018-04-04 DIAGNOSIS — N400 Enlarged prostate without lower urinary tract symptoms: Secondary | ICD-10-CM | POA: Diagnosis not present

## 2018-04-04 DIAGNOSIS — Z811 Family history of alcohol abuse and dependence: Secondary | ICD-10-CM

## 2018-04-04 DIAGNOSIS — Z86711 Personal history of pulmonary embolism: Secondary | ICD-10-CM

## 2018-04-04 DIAGNOSIS — R410 Disorientation, unspecified: Secondary | ICD-10-CM

## 2018-04-04 DIAGNOSIS — R748 Abnormal levels of other serum enzymes: Secondary | ICD-10-CM | POA: Diagnosis not present

## 2018-04-04 DIAGNOSIS — E039 Hypothyroidism, unspecified: Secondary | ICD-10-CM | POA: Diagnosis not present

## 2018-04-04 DIAGNOSIS — I248 Other forms of acute ischemic heart disease: Principal | ICD-10-CM | POA: Diagnosis present

## 2018-04-04 DIAGNOSIS — R749 Abnormal serum enzyme level, unspecified: Secondary | ICD-10-CM | POA: Diagnosis not present

## 2018-04-04 DIAGNOSIS — R079 Chest pain, unspecified: Secondary | ICD-10-CM | POA: Diagnosis not present

## 2018-04-04 DIAGNOSIS — F22 Delusional disorders: Secondary | ICD-10-CM | POA: Diagnosis not present

## 2018-04-04 DIAGNOSIS — I214 Non-ST elevation (NSTEMI) myocardial infarction: Secondary | ICD-10-CM | POA: Diagnosis not present

## 2018-04-04 LAB — CBC WITH DIFFERENTIAL/PLATELET
Abs Immature Granulocytes: 0.01 10*3/uL (ref 0.00–0.07)
Basophils Absolute: 0.1 10*3/uL (ref 0.0–0.1)
Basophils Relative: 1 %
EOS ABS: 0.2 10*3/uL (ref 0.0–0.5)
Eosinophils Relative: 2 %
HCT: 41.3 % (ref 39.0–52.0)
Hemoglobin: 13 g/dL (ref 13.0–17.0)
IMMATURE GRANULOCYTES: 0 %
Lymphocytes Relative: 14 %
Lymphs Abs: 1 10*3/uL (ref 0.7–4.0)
MCH: 28.8 pg (ref 26.0–34.0)
MCHC: 31.5 g/dL (ref 30.0–36.0)
MCV: 91.6 fL (ref 80.0–100.0)
Monocytes Absolute: 0.7 10*3/uL (ref 0.1–1.0)
Monocytes Relative: 10 %
NEUTROS ABS: 5.2 10*3/uL (ref 1.7–7.7)
Neutrophils Relative %: 73 %
Platelets: 237 10*3/uL (ref 150–400)
RBC: 4.51 MIL/uL (ref 4.22–5.81)
RDW: 13.4 % (ref 11.5–15.5)
WBC: 7.2 10*3/uL (ref 4.0–10.5)
nRBC: 0 % (ref 0.0–0.2)

## 2018-04-04 LAB — ACETAMINOPHEN LEVEL: Acetaminophen (Tylenol), Serum: <10 ug/mL — ABNORMAL LOW (ref 10–30)

## 2018-04-04 LAB — ETHANOL: Alcohol, Ethyl (B): <10 mg/dL (ref <10)

## 2018-04-04 LAB — COMPREHENSIVE METABOLIC PANEL
ALT: 20 U/L (ref 0–44)
AST: 41 U/L (ref 15–41)
Albumin: 3.8 g/dL (ref 3.5–5.0)
Alkaline Phosphatase: 42 U/L (ref 38–126)
Anion gap: 9 (ref 5–15)
BUN: 22 mg/dL (ref 8–23)
CO2: 24 mmol/L (ref 22–32)
Calcium: 9.1 mg/dL (ref 8.9–10.3)
Chloride: 107 mmol/L (ref 98–111)
Creatinine, Ser: 1.68 mg/dL — ABNORMAL HIGH (ref 0.61–1.24)
GFR calc Af Amer: 45 mL/min — ABNORMAL LOW (ref >60)
GFR calc non Af Amer: 39 mL/min — ABNORMAL LOW (ref >60)
Glucose, Bld: 112 mg/dL — ABNORMAL HIGH (ref 70–99)
POTASSIUM: 3.9 mmol/L (ref 3.5–5.1)
Sodium: 140 mmol/L (ref 135–145)
Total Bilirubin: 0.4 mg/dL (ref 0.3–1.2)
Total Protein: 7 g/dL (ref 6.5–8.1)

## 2018-04-04 LAB — TROPONIN I: Troponin I: 0.34 ng/mL (ref <0.03)

## 2018-04-04 LAB — SALICYLATE LEVEL: Salicylate Lvl: <7 mg/dL (ref 2.8–30.0)

## 2018-04-04 MED ORDER — HEPARIN (PORCINE) 25000 UT/250ML-% IV SOLN
1100.0000 [IU]/h | INTRAVENOUS | Status: DC
  Administered 2018-04-05 (×2): 1100 [IU]/h via INTRAVENOUS
  Filled 2018-04-04 (×2): qty 250

## 2018-04-04 MED ORDER — ASPIRIN 81 MG PO CHEW
324.0000 mg | CHEWABLE_TABLET | Freq: Once | ORAL | Status: AC
  Administered 2018-04-04: 324 mg via ORAL
  Filled 2018-04-04: qty 4

## 2018-04-04 MED ORDER — SODIUM CHLORIDE 0.9 % IV SOLN
1.0000 g | Freq: Once | INTRAVENOUS | Status: AC
  Administered 2018-04-05: 1 g via INTRAVENOUS
  Filled 2018-04-04: qty 10

## 2018-04-04 MED ORDER — HEPARIN SODIUM (PORCINE) 5000 UNIT/ML IJ SOLN
4000.0000 [IU] | Freq: Once | INTRAMUSCULAR | Status: AC
  Administered 2018-04-04: 4000 [IU] via INTRAVENOUS
  Filled 2018-04-04: qty 1

## 2018-04-04 NOTE — ED Notes (Signed)
Patient transported to CT 

## 2018-04-04 NOTE — ED Triage Notes (Addendum)
Pt to ED talking to first nurse about bugs crawling on him and living in his bloodstream and under his skin. Pt reports when he applies heat the bugs come out. Bugs appeared after pt stayed in a motel that he will not name.Pt talking quickly and reports he is nervous at this time but does not want to talk to anyone before his pastor arrives. Pt is calm and cooperative and has allowed RN to start triage.

## 2018-04-04 NOTE — Progress Notes (Signed)
ANTICOAGULATION CONSULT NOTE - Initial Consult  Pharmacy Consult for Heparin  Indication: chest pain/ACS  Allergies  Allergen Reactions  . Ciprofloxacin Rash    Patient Measurements: Weight: 183 lb 10.3 oz (83.3 kg) Heparin Dosing Weight: 83.3 kg   Vital Signs: Temp: 97.6 F (36.4 C) (03/06 2111) Temp Source: Oral (03/06 2111) BP: 127/74 (03/06 2230) Pulse Rate: 90 (03/06 2245)  Labs: Recent Labs    04/04/18 2255  HGB 13.0  HCT 41.3  PLT 237  CREATININE 1.68*  TROPONINI 0.34*    Estimated Creatinine Clearance: 40.4 mL/min (A) (by C-G formula based on SCr of 1.68 mg/dL (H)).   Medical History: Past Medical History:  Diagnosis Date  . BPH (benign prostatic hyperplasia)   . CAD (coronary artery disease)   . GERD (gastroesophageal reflux disease)   . High cholesterol   . Hypothyroidism   . Myocardial infarction ([place]) 09/2016  . Nephrolithiasis   . Thyroid disease     Medications:  (Not in a hospital admission)   Assessment: Pharmacy consulted to dose heparin in this 78 year old male admitted with ACS/NSTEMI.   No prior anticoag noted.   CrCl = 40.4 ml/min   Goal of Therapy:  Heparin level 0.3-0.7 units/ml Monitor platelets by anticoagulation protocol: Yes   Plan:  Give 4000 units bolus x 1 Start heparin infusion at 1100 units/hr Check anti-Xa level in 8 hours and daily while on heparin Continue to monitor H&H and platelets  Nil Xiong D 04/04/2018,11:55 PM

## 2018-04-04 NOTE — ED Notes (Signed)
Date and time results received: 04/04/18 11:34 PM  (use smartphrase ".now" to insert current time)  Test: troponin Critical Value: 0.34  Name of Provider Notified: malinda  Orders Received? Or Actions Taken?: Orders Received - See Orders for details

## 2018-04-04 NOTE — ED Notes (Signed)
Pt states he was staying in motel when bugs got into his skin.

## 2018-04-04 NOTE — ED Provider Notes (Signed)
[facility] Emergency Department Provider Note   ____________________________________________   First MD Initiated Contact with Patient 04/04/18 2204     (approximate)  I have reviewed the triage vital signs and the nursing notes.   HISTORY  Chief Complaint Insect Bite    HPI Christopher Carlson is a 78 y.o. male who reports he was living in a house behind his daughter's house.  His daughter was having his house fixed up so had him move into [place] room.  He was in the hotel room went to take a shower and bugs begin jumping out of the shower all over the place and have gotten in his blood veins.  He says when he gets hot the bugs come out of his blood veins and we gets cold to go back inside.  He is insisting that there are bugs in his veins in his hand and he is been trying to get them out.  There are some fresh scabs on his fingers where he has been digging to get the bugs out.  Review of his old records show nothing like this is ever happened before.  Called his pastor his pastor is here.  He is only known for 6 months but does not know that this is ever happened before.      Past Medical History:  Diagnosis Date  . BPH (benign prostatic hyperplasia)   . CAD (coronary artery disease)   . GERD (gastroesophageal reflux disease)   . High cholesterol   . Hypothyroidism   . Myocardial infarction ([place]) 09/2016  . Nephrolithiasis   . Thyroid disease     Patient Active Problem List   Diagnosis Date Noted  . Hematuria, gross 12/21/2017  . Pulmonary embolism ([place]) 02/01/2017  . Syncope 10/10/2016    Past Surgical History:  Procedure Laterality Date  . CORONARY STENT INTERVENTION N/A 10/12/2016   Procedure: CORONARY STENT INTERVENTION;  Surgeon: Yolonda Kida, MD;  Location: [place] [facility];  Service: Cardiovascular;  Laterality: N/A;  . CYSTOSCOPY W/ RETROGRADES Bilateral 12/16/2017   Procedure: CYSTOSCOPY WITH RETROGRADE PYELOGRAM;   Surgeon: Hollice Espy, MD;  Location: [facility];  Service: Urology;  Laterality: Bilateral;  . CYSTOSCOPY WITH FULGERATION  12/16/2017   Procedure: CYSTOSCOPY WITH FULGERATION of bleeding prostate;  Surgeon: Hollice Espy, MD;  Location: [facility];  Service: Urology;;  . [name] WITH URETHRAL DILATATION N/A 12/16/2017   Procedure: CYSTOSCOPY WITH URETHRAL DILATATION;  Surgeon: Hollice Espy, MD;  Location: [facility];  Service: Urology;  Laterality: N/A;  . HERNIA REPAIR    . LEFT HEART CATH AND CORONARY ANGIOGRAPHY N/A 10/12/2016   Procedure: LEFT HEART CATH AND CORONARY ANGIOGRAPHY and possible pci;  Surgeon: Yolonda Kida, MD;  Location: [place] [facility];  Service: Cardiovascular;  Laterality: N/A;    Prior to Admission medications   Medication Sig Start Date End Date Taking? Authorizing Provider  aspirin EC 81 MG tablet Take 81 mg by mouth daily.    [provider]  atorvastatin (LIPITOR) 40 MG tablet Take 40 mg by mouth daily. 07/03/16   [provider]  clonazePAM (KLONOPIN) 0.5 MG tablet Take 0.5 mg by mouth at bedtime.  09/10/16   [provider]  finasteride (PROSCAR) 5 MG tablet Take 5 mg by mouth daily.    [provider]  HYDROcodone-acetaminophen (NORCO/VICODIN) 5-325 MG tablet Take 1-2 tablets by mouth every 6 (six) hours as needed for moderate pain. Patient not taking: Reported on 12/21/2017  12/16/17   Hollice Espy, MD  levothyroxine (SYNTHROID, LEVOTHROID) 112 MCG tablet Take 112 mcg by mouth daily before breakfast.  07/03/16   [provider]  nitroGLYCERIN (NITROSTAT) 0.4 MG SL tablet Place 1 tablet (0.4 mg total) under the tongue every 5 (five) minutes as needed for chest pain. 10/13/16   Bettey Costa, MD  pantoprazole (PROTONIX) 40 MG tablet Take 40 mg by mouth daily. 07/03/16   [provider]  QUEtiapine (SEROQUEL) 50 MG tablet Take 50 mg by mouth at bedtime. 07/03/16   [provider]  tamsulosin  (FLOMAX) 0.4 MG CAPS capsule Take 1 capsule (0.4 mg total) by mouth daily. 12/25/17   Zara Council A, PA-C    Allergies Ciprofloxacin  Family History  Problem Relation Age of Onset  . Cancer Father   . Alcohol abuse Father   . Prostate cancer Neg Hx   . Bladder Cancer Neg Hx   . Kidney cancer Neg Hx     Social History Social History   Tobacco Use  . Smoking status: Former Smoker    Packs/day: 2.50    Years: 20.00    Pack years: 50.00    Types: Cigarettes  . Smokeless tobacco: Never Used  Substance Use Topics  . Alcohol use: No  . Drug use: No    Review of Systems  Constitutional: No fever/chills Eyes: No visual changes. ENT: No sore throat. Cardiovascular: Denies chest pain. Respiratory: Denies shortness of breath. Gastrointestinal: No abdominal pain.  No nausea, no vomiting.  No diarrhea.  No constipation. Genitourinary: Negative for dysuria. Musculoskeletal: Negative for back pain. Skin: Negative for rash. Neurological: Negative for headaches, focal weakness  ____________________________________________   PHYSICAL EXAM:  VITAL SIGNS: ED Triage Vitals  Enc Vitals Group     BP 04/04/18 2111 (!) 162/73     Pulse Rate 04/04/18 2111 76     Resp 04/04/18 2111 16     Temp 04/04/18 2111 97.6 F (36.4 C)     Temp Source 04/04/18 2111 Oral     SpO2 04/04/18 2111 96 %     Weight 04/04/18 2112 183 lb 10.3 oz (83.3 kg)     Height --      Head Circumference --      Peak Flow --      Pain Score 04/04/18 2112 0     Pain Loc --      Pain Edu? --      Excl. in [place]? --     Constitutional: Alert and oriented. Well appearing and in no acute distress. Eyes: Conjunctivae are normal. PER. EOMI. Head: Atraumatic. Nose: No congestion/rhinnorhea. Mouth/Throat: Mucous membranes are moist.  Oropharynx non-erythematous. Neck: No stridor. Cardiovascular: Normal rate, regular rhythm. Grossly normal heart sounds.  Good peripheral circulation. Respiratory: Normal  respiratory effort.  No retractions. Lungs CTAB. Gastrointestinal: Soft and nontender. No distention. No abdominal bruits. . Musculoskeletal: No lower extremity tenderness 1+ bilateral edema.   Neurologic:  Normal speech and language. No gross focal neurologic deficits are appreciated. Skin:  Skin is warm, dry and intact.  Psychiatric: Mood and affect are normal. Speech and behavior are normal except for when he begins talking about the bugs in his blood veins..  ____________________________________________   LABS (all labs ordered are listed, but only abnormal results are displayed)  Labs Reviewed  COMPREHENSIVE METABOLIC PANEL - Abnormal; Notable for the following components:      Result Value   Glucose, Bld 112 (*)    Creatinine, Ser 1.68 (*)  GFR calc non Af Amer 39 (*)    GFR calc Af Amer 45 (*)    All other components within normal limits  ACETAMINOPHEN LEVEL - Abnormal; Notable for the following components:   Acetaminophen (Tylenol), Serum <10 (*)    All other components within normal limits  TROPONIN I - Abnormal; Notable for the following components:   Troponin I 0.34 (*)    All other components within normal limits  ETHANOL  SALICYLATE LEVEL  CBC WITH DIFFERENTIAL/PLATELET  URINALYSIS, COMPLETE (UACMP) WITH MICROSCOPIC  URINE DRUG SCREEN, QUALITATIVE ([facility] ONLY)   ____________________________________________  EKG   ____________________________________________  RADIOLOGY  ED MD interpretation: CT of the head read by radiology reviewed by me is normal for his age  Official radiology report(s): Ct Head Wo Contrast  Result Date: 04/04/2018 CLINICAL DATA:  Altered level of consciousness. EXAM: CT HEAD WITHOUT CONTRAST TECHNIQUE: Contiguous axial images were obtained from the base of the skull through the vertex without intravenous contrast. COMPARISON:  None. FINDINGS: BRAIN: No intraparenchymal hemorrhage, mass effect nor midline shift. No parenchymal brain volume  loss for age. No hydrocephalus. Minimal supratentorial white matter hypodensities less than expected for patient's age, though non-specific are most compatible with chronic small vessel ischemic disease. No acute large vascular territory infarcts. No abnormal extra-axial fluid collections. Basal cisterns are patent. VASCULAR: Mild calcific atherosclerosis of the carotid siphons. SKULL: No skull fracture. No significant scalp soft tissue swelling. SINUSES/ORBITS: Trace paranasal sinus mucosal thickening. Mastoid air cells are well aerated.The included ocular globes and orbital contents are non-suspicious. OTHER: None. IMPRESSION: Negative non-contrast CT HEAD for age. Electronically Signed   By: Elon Alas M.D.   On: 04/04/2018 22:36   Dg Chest Portable 1 View  Result Date: 04/04/2018 CLINICAL DATA:  Confusion and anxiety beginning today. EXAM: PORTABLE CHEST 1 VIEW COMPARISON:  10/10/2016 FINDINGS: Borderline cardiomegaly. Aortic atherosclerosis without aneurysm. No pulmonary consolidation, effusion or pneumothorax. IMPRESSION: No active disease.  Aortic atherosclerosis. Electronically Signed   By: Ashley Royalty M.D.   On: 04/04/2018 23:01    ____________________________________________   PROCEDURES  Procedure(s) performed (including Critical Care):  Procedures   ____________________________________________   INITIAL IMPRESSION / ASSESSMENT AND PLAN / ED COURSE  Patient has no history that I can find in his old records here or in care everywhere of any sort of dementia or other mental illness.  We will evaluate him thoroughly and admit him I expect for altered mental status.              ____________________________________________   FINAL CLINICAL IMPRESSION(S) / ED DIAGNOSES  Final diagnoses:  Altered mental status, unspecified altered mental status type  Elevated troponin     ED Discharge Orders    None       Note:  This document was prepared using Dragon voice  recognition software and may include unintentional dictation errors.    Nena Polio, MD 04/05/18 [phone]

## 2018-04-04 NOTE — ED Notes (Signed)
Pts daughter: [name] hall [phone]

## 2018-04-05 ENCOUNTER — Encounter: Payer: Self-pay | Admitting: *Deleted

## 2018-04-05 DIAGNOSIS — N400 Enlarged prostate without lower urinary tract symptoms: Secondary | ICD-10-CM | POA: Diagnosis present

## 2018-04-05 DIAGNOSIS — I214 Non-ST elevation (NSTEMI) myocardial infarction: Secondary | ICD-10-CM | POA: Diagnosis present

## 2018-04-05 DIAGNOSIS — R778 Other specified abnormalities of plasma proteins: Secondary | ICD-10-CM

## 2018-04-05 DIAGNOSIS — F4310 Post-traumatic stress disorder, unspecified: Secondary | ICD-10-CM | POA: Diagnosis present

## 2018-04-05 DIAGNOSIS — E039 Hypothyroidism, unspecified: Secondary | ICD-10-CM | POA: Diagnosis present

## 2018-04-05 DIAGNOSIS — R410 Disorientation, unspecified: Secondary | ICD-10-CM | POA: Diagnosis present

## 2018-04-05 DIAGNOSIS — Z881 Allergy status to other antibiotic agents status: Secondary | ICD-10-CM | POA: Diagnosis not present

## 2018-04-05 DIAGNOSIS — I2510 Atherosclerotic heart disease of native coronary artery without angina pectoris: Secondary | ICD-10-CM | POA: Diagnosis present

## 2018-04-05 DIAGNOSIS — F22 Delusional disorders: Secondary | ICD-10-CM | POA: Diagnosis not present

## 2018-04-05 DIAGNOSIS — R7989 Other specified abnormal findings of blood chemistry: Secondary | ICD-10-CM

## 2018-04-05 DIAGNOSIS — Z7982 Long term (current) use of aspirin: Secondary | ICD-10-CM | POA: Diagnosis not present

## 2018-04-05 DIAGNOSIS — I248 Other forms of acute ischemic heart disease: Secondary | ICD-10-CM | POA: Diagnosis present

## 2018-04-05 DIAGNOSIS — Z955 Presence of coronary angioplasty implant and graft: Secondary | ICD-10-CM | POA: Diagnosis not present

## 2018-04-05 DIAGNOSIS — Z86718 Personal history of other venous thrombosis and embolism: Secondary | ICD-10-CM | POA: Diagnosis not present

## 2018-04-05 DIAGNOSIS — Z811 Family history of alcohol abuse and dependence: Secondary | ICD-10-CM | POA: Diagnosis not present

## 2018-04-05 DIAGNOSIS — Z7902 Long term (current) use of antithrombotics/antiplatelets: Secondary | ICD-10-CM | POA: Diagnosis not present

## 2018-04-05 DIAGNOSIS — Z86711 Personal history of pulmonary embolism: Secondary | ICD-10-CM | POA: Diagnosis not present

## 2018-04-05 DIAGNOSIS — Z79890 Hormone replacement therapy (postmenopausal): Secondary | ICD-10-CM | POA: Diagnosis not present

## 2018-04-05 DIAGNOSIS — G4700 Insomnia, unspecified: Secondary | ICD-10-CM | POA: Diagnosis present

## 2018-04-05 DIAGNOSIS — Z809 Family history of malignant neoplasm, unspecified: Secondary | ICD-10-CM | POA: Diagnosis not present

## 2018-04-05 DIAGNOSIS — I255 Ischemic cardiomyopathy: Secondary | ICD-10-CM | POA: Diagnosis present

## 2018-04-05 DIAGNOSIS — N189 Chronic kidney disease, unspecified: Secondary | ICD-10-CM | POA: Diagnosis present

## 2018-04-05 DIAGNOSIS — Z87891 Personal history of nicotine dependence: Secondary | ICD-10-CM | POA: Diagnosis not present

## 2018-04-05 DIAGNOSIS — E7800 Pure hypercholesterolemia, unspecified: Secondary | ICD-10-CM | POA: Diagnosis present

## 2018-04-05 DIAGNOSIS — I252 Old myocardial infarction: Secondary | ICD-10-CM | POA: Diagnosis not present

## 2018-04-05 DIAGNOSIS — Z79899 Other long term (current) drug therapy: Secondary | ICD-10-CM | POA: Diagnosis not present

## 2018-04-05 DIAGNOSIS — K219 Gastro-esophageal reflux disease without esophagitis: Secondary | ICD-10-CM | POA: Diagnosis present

## 2018-04-05 LAB — URINE DRUG SCREEN, QUALITATIVE (ARMC ONLY)
Amphetamines, Ur Screen: NOT DETECTED
Barbiturates, Ur Screen: NOT DETECTED
Benzodiazepine, Ur Scrn: NOT DETECTED
CANNABINOID 50 NG, UR ~~LOC~~: NOT DETECTED
Cocaine Metabolite,Ur ~~LOC~~: NOT DETECTED
MDMA (Ecstasy)Ur Screen: NOT DETECTED
Methadone Scn, Ur: NOT DETECTED
Opiate, Ur Screen: NOT DETECTED
Phencyclidine (PCP) Ur S: NOT DETECTED
Tricyclic, Ur Screen: NOT DETECTED

## 2018-04-05 LAB — URINALYSIS, COMPLETE (UACMP) WITH MICROSCOPIC
Bacteria, UA: NONE SEEN
Bilirubin Urine: NEGATIVE
Glucose, UA: NEGATIVE mg/dL
Ketones, ur: NEGATIVE mg/dL
Nitrite: NEGATIVE
Protein, ur: NEGATIVE mg/dL
Specific Gravity, Urine: 1.016 (ref 1.005–1.030)
Squamous Epithelial / HPF: NONE SEEN (ref 0–5)
pH: 6 (ref 5.0–8.0)

## 2018-04-05 LAB — TSH: TSH: 5.123 u[IU]/mL — ABNORMAL HIGH (ref 0.350–4.500)

## 2018-04-05 LAB — HEPARIN LEVEL (UNFRACTIONATED)
Heparin Unfractionated: 0.32 IU/mL (ref 0.30–0.70)
Heparin Unfractionated: 0.34 IU/mL (ref 0.30–0.70)

## 2018-04-05 LAB — PROTIME-INR
INR: 1.1 (ref 0.8–1.2)
Prothrombin Time: 14.1 seconds (ref 11.4–15.2)

## 2018-04-05 LAB — APTT: aPTT: 30 seconds (ref 24–36)

## 2018-04-05 MED ORDER — SULFAMETHOXAZOLE-TRIMETHOPRIM 800-160 MG PO TABS
1.0000 | ORAL_TABLET | Freq: Two times a day (BID) | ORAL | Status: DC
  Administered 2018-04-05: 1 via ORAL
  Filled 2018-04-05 (×2): qty 1

## 2018-04-05 MED ORDER — NITROGLYCERIN 0.4 MG SL SUBL: 0.4000 mg | SUBLINGUAL_TABLET | SUBLINGUAL | Status: DC | PRN

## 2018-04-05 MED ORDER — TRIAMCINOLONE ACETONIDE 0.1 % EX CREA
1.0000 "application " | TOPICAL_CREAM | Freq: Two times a day (BID) | CUTANEOUS | Status: DC
  Administered 2018-04-05: 1 via TOPICAL
  Filled 2018-04-05: qty 15

## 2018-04-05 MED ORDER — ONDANSETRON HCL 4 MG/2ML IJ SOLN: 4.0000 mg | Freq: Four times a day (QID) | INTRAMUSCULAR | Status: DC | PRN

## 2018-04-05 MED ORDER — ATORVASTATIN CALCIUM 20 MG PO TABS
40.0000 mg | ORAL_TABLET | Freq: Every day | ORAL | Status: DC
  Administered 2018-04-05 – 2018-04-06 (×2): 40 mg via ORAL
  Filled 2018-04-05 (×2): qty 2

## 2018-04-05 MED ORDER — PANTOPRAZOLE SODIUM 40 MG PO TBEC
40.0000 mg | DELAYED_RELEASE_TABLET | Freq: Every day | ORAL | Status: DC
  Administered 2018-04-05 – 2018-04-07 (×3): 40 mg via ORAL
  Filled 2018-04-05 (×3): qty 1

## 2018-04-05 MED ORDER — QUETIAPINE FUMARATE 25 MG PO TABS
50.0000 mg | ORAL_TABLET | Freq: Every day | ORAL | Status: DC
  Administered 2018-04-05 – 2018-04-06 (×2): 50 mg via ORAL
  Filled 2018-04-05 (×2): qty 2

## 2018-04-05 MED ORDER — LORATADINE 10 MG PO TABS
10.0000 mg | ORAL_TABLET | Freq: Every day | ORAL | Status: DC
  Administered 2018-04-05 – 2018-04-07 (×3): 10 mg via ORAL
  Filled 2018-04-05 (×3): qty 1

## 2018-04-05 MED ORDER — TAMSULOSIN HCL 0.4 MG PO CAPS
0.4000 mg | ORAL_CAPSULE | Freq: Every day | ORAL | Status: DC
  Administered 2018-04-05 – 2018-04-07 (×3): 0.4 mg via ORAL
  Filled 2018-04-05 (×3): qty 1

## 2018-04-05 MED ORDER — FINASTERIDE 5 MG PO TABS
5.0000 mg | ORAL_TABLET | Freq: Every day | ORAL | Status: DC
  Filled 2018-04-05 (×2): qty 1

## 2018-04-05 MED ORDER — ACETAMINOPHEN 650 MG RE SUPP: 650.0000 mg | Freq: Four times a day (QID) | RECTAL | Status: DC | PRN

## 2018-04-05 MED ORDER — DOCUSATE SODIUM 100 MG PO CAPS
100.0000 mg | ORAL_CAPSULE | Freq: Two times a day (BID) | ORAL | Status: DC
  Administered 2018-04-05 – 2018-04-06 (×3): 100 mg via ORAL
  Filled 2018-04-05 (×4): qty 1

## 2018-04-05 MED ORDER — LEVOTHYROXINE SODIUM 112 MCG PO TABS
112.0000 ug | ORAL_TABLET | Freq: Every day | ORAL | Status: DC
  Administered 2018-04-05 – 2018-04-07 (×3): 112 ug via ORAL
  Filled 2018-04-05 (×3): qty 1

## 2018-04-05 MED ORDER — ONDANSETRON HCL 4 MG PO TABS: 4.0000 mg | ORAL_TABLET | Freq: Four times a day (QID) | ORAL | Status: DC | PRN

## 2018-04-05 MED ORDER — ASPIRIN EC 81 MG PO TBEC
81.0000 mg | DELAYED_RELEASE_TABLET | Freq: Every day | ORAL | Status: DC
  Administered 2018-04-05 – 2018-04-07 (×3): 81 mg via ORAL
  Filled 2018-04-05 (×3): qty 1

## 2018-04-05 MED ORDER — CLONAZEPAM 0.5 MG PO TABS
0.5000 mg | ORAL_TABLET | Freq: Every day | ORAL | Status: DC
  Administered 2018-04-05 – 2018-04-06 (×2): 0.5 mg via ORAL
  Filled 2018-04-05 (×2): qty 1

## 2018-04-05 MED ORDER — ACETAMINOPHEN 325 MG PO TABS: 650.0000 mg | ORAL_TABLET | Freq: Four times a day (QID) | ORAL | Status: DC | PRN

## 2018-04-05 NOTE — Progress Notes (Signed)
[place] for Heparin  Indication: chest pain/ACS  Allergies  Allergen Reactions  . Ciprofloxacin Rash    Patient Measurements: Height: 6' (182.9 cm) Weight: 189 lb 6.4 oz (85.9 kg) IBW/kg (Calculated) : 77.6 Heparin Dosing Weight: 83.3 kg   Vital Signs: Temp: 98.1 F (36.7 C) (03/07 0900) Temp Source: Oral (03/07 0900) BP: 124/73 (03/07 0900) Pulse Rate: 64 (03/07 0900)  Labs: Recent Labs    04/04/18 2255 04/04/18 2353 04/05/18 0821  HGB 13.0  --   --   HCT 41.3  --   --   PLT 237  --   --   APTT  --  30  --   LABPROT  --  14.1  --   INR  --  1.1  --   HEPARINUNFRC  --   --  0.32  CREATININE 1.68*  --   --   TROPONINI 0.34*  --   --     Estimated Creatinine Clearance: 40.4 mL/min (A) (by C-G formula based on SCr of 1.68 mg/dL (H)).   Medical History: Past Medical History:  Diagnosis Date  . BPH (benign prostatic hyperplasia)   . CAD (coronary artery disease)   . GERD (gastroesophageal reflux disease)   . High cholesterol   . Hypothyroidism   . Myocardial infarction ([place]) 09/2016  . Nephrolithiasis   . Thyroid disease     Medications:  Medications Prior to Admission  Medication Sig Dispense Refill Last Dose  . aspirin EC 81 MG tablet Take 81 mg by mouth daily.   04/04/2018 at Unknown time  . atorvastatin (LIPITOR) 40 MG tablet Take 40 mg by mouth at bedtime.    04/03/2018 at Unknown time  . cetirizine (ZYRTEC) 10 MG tablet Take 10 mg by mouth daily.   04/04/2018 at Unknown time  . clonazePAM (KLONOPIN) 0.5 MG tablet Take 0.5 mg by mouth at bedtime.    04/03/2018 at Unknown time  . finasteride (PROSCAR) 5 MG tablet Take 5 mg by mouth at bedtime.    04/03/2018 at Unknown time  . levothyroxine (SYNTHROID, LEVOTHROID) 112 MCG tablet Take 112 mcg by mouth daily before breakfast.    04/04/2018 at Unknown time  . nitroGLYCERIN (NITROSTAT) 0.4 MG SL tablet Place 1 tablet (0.4 mg total) under the tongue every 5 (five) minutes as needed  for chest pain. 30 tablet 0 prn at prn  . pantoprazole (PROTONIX) 40 MG tablet Take 40 mg by mouth daily.   04/04/2018 at Unknown time  . QUEtiapine (SEROQUEL) 50 MG tablet Take 50 mg by mouth at bedtime.   04/03/2018 at Unknown time  . sulfamethoxazole-trimethoprim (BACTRIM DS,SEPTRA DS) 800-160 MG tablet Take 1 tablet by mouth 2 (two) times daily.   04/04/2018 at Unknown time  . tamsulosin (FLOMAX) 0.4 MG CAPS capsule Take 1 capsule (0.4 mg total) by mouth daily. 90 capsule 3 04/04/2018 at Unknown time  . triamcinolone cream (KENALOG) 0.1 % Apply 1 application topically 2 (two) times daily.   04/04/2018 at Unknown time    Assessment: Pharmacy consulted to dose heparin in this 78 year old male admitted with ACS/NSTEMI.   No prior anticoag noted.   CrCl = 40.4 ml/min   Goal of Therapy:  Heparin level 0.3-0.7 units/ml Monitor platelets by anticoagulation protocol: Yes   Plan:  Give 4000 units bolus x 1 Start heparin infusion at 1100 units/hr Check anti-Xa level in 8 hours and daily while on heparin Continue to monitor H&H and platelets  3/7  08:21  HL = 0.32. Continue current drip rate.  Recheck HL in 8 hours tonight at 16:30.  Shawnise Peterkin K, RPH 04/05/2018,10:40 AM

## 2018-04-05 NOTE — Progress Notes (Signed)
[place] for Heparin  Indication: chest pain/ACS  Allergies  Allergen Reactions  . Ciprofloxacin Rash    Patient Measurements: Height: 6' (182.9 cm) Weight: 189 lb 6.4 oz (85.9 kg) IBW/kg (Calculated) : 77.6 Heparin Dosing Weight: 83.3 kg   Vital Signs: Temp: 98.2 F (36.8 C) (03/07 1628) Temp Source: Oral (03/07 1628) BP: 130/72 (03/07 1628) Pulse Rate: 60 (03/07 1628)  Labs: Recent Labs    04/04/18 2255 04/04/18 2353 04/05/18 0821 04/05/18 1612  HGB 13.0  --   --   --   HCT 41.3  --   --   --   PLT 237  --   --   --   APTT  --  30  --   --   LABPROT  --  14.1  --   --   INR  --  1.1  --   --   HEPARINUNFRC  --   --  0.32 0.34  CREATININE 1.68*  --   --   --   TROPONINI 0.34*  --   --   --     Estimated Creatinine Clearance: 40.4 mL/min (A) (by C-G formula based on SCr of 1.68 mg/dL (H)).   Medical History: Past Medical History:  Diagnosis Date  . BPH (benign prostatic hyperplasia)   . CAD (coronary artery disease)   . GERD (gastroesophageal reflux disease)   . High cholesterol   . Hypothyroidism   . Myocardial infarction ([place]) 09/2016  . Nephrolithiasis   . Thyroid disease     Medications:  Medications Prior to Admission  Medication Sig Dispense Refill Last Dose  . aspirin EC 81 MG tablet Take 81 mg by mouth daily.   04/04/2018 at Unknown time  . atorvastatin (LIPITOR) 40 MG tablet Take 40 mg by mouth at bedtime.    04/03/2018 at Unknown time  . cetirizine (ZYRTEC) 10 MG tablet Take 10 mg by mouth daily.   04/04/2018 at Unknown time  . clonazePAM (KLONOPIN) 0.5 MG tablet Take 0.5 mg by mouth at bedtime.    04/03/2018 at Unknown time  . finasteride (PROSCAR) 5 MG tablet Take 5 mg by mouth at bedtime.    04/03/2018 at Unknown time  . levothyroxine (SYNTHROID, LEVOTHROID) 112 MCG tablet Take 112 mcg by mouth daily before breakfast.    04/04/2018 at Unknown time  . nitroGLYCERIN (NITROSTAT) 0.4 MG SL tablet Place 1 tablet (0.4  mg total) under the tongue every 5 (five) minutes as needed for chest pain. 30 tablet 0 prn at prn  . pantoprazole (PROTONIX) 40 MG tablet Take 40 mg by mouth daily.   04/04/2018 at Unknown time  . QUEtiapine (SEROQUEL) 50 MG tablet Take 50 mg by mouth at bedtime.   04/03/2018 at Unknown time  . sulfamethoxazole-trimethoprim (BACTRIM DS,SEPTRA DS) 800-160 MG tablet Take 1 tablet by mouth 2 (two) times daily.   04/04/2018 at Unknown time  . tamsulosin (FLOMAX) 0.4 MG CAPS capsule Take 1 capsule (0.4 mg total) by mouth daily. 90 capsule 3 04/04/2018 at Unknown time  . triamcinolone cream (KENALOG) 0.1 % Apply 1 application topically 2 (two) times daily.   04/04/2018 at Unknown time    Assessment: Pharmacy consulted to dose heparin in this 78 year old male admitted with ACS/NSTEMI.   No prior anticoag noted.   CrCl = 40.4 ml/min   3/7 0821 HL 032  3/7 1612 HL 0.34  Goal of Therapy:  Heparin level 0.3-0.7 units/ml Monitor platelets by  anticoagulation protocol: Yes   Plan:  Continue current rate and will recheck heparin level with AM labs.   Oswald Hillock, PharmD, BCPS 04/05/2018,5:57 PM

## 2018-04-05 NOTE — ED Notes (Signed)
ED TO INPATIENT HANDOFF REPORT  ED Nurse Name and Phone #: Landry Mellow [phone]  S Name/Age/Gender Christopher Carlson 78 y.o. male Room/Bed: ED19A/ED19A  Code Status   Code Status: Prior  Home/SNF/Other Home Patient oriented to: self, place, time and situation Is this baseline? Yes   Triage Complete: Triage complete  Chief Complaint rash?? bed bugs??  Triage Note Pt to ED talking to first nurse about bugs crawling on him and living in his bloodstream and under his skin. Pt reports when he applies heat the bugs come out. Bugs appeared after pt stayed in a motel that he will not name.Pt talking quickly and reports he is nervous at this time but does not want to talk to anyone before his pastor arrives. Pt is calm and cooperative and has allowed RN to start triage.    Allergies Allergies  Allergen Reactions  . Ciprofloxacin Rash    Level of Care/Admitting Diagnosis ED Disposition    ED Disposition Condition [facility] Area: [place] [[id number]]  Level of Care: Telemetry [5]  Diagnosis: NSTEMI (non-ST elevated myocardial infarction) [facility]) [[id number]]  Admitting Physician: Harrie Foreman [[id number]]  Attending Physician: Harrie Foreman [[id number]]  Estimated length of stay: past midnight tomorrow  Certification:: I certify this patient will need inpatient services for at least 2 midnights  PT Class (Do Not Modify): Inpatient [101]  PT Acc Code (Do Not Modify): Private [1]       B Medical/Surgery History Past Medical History:  Diagnosis Date  . BPH (benign prostatic hyperplasia)   . CAD (coronary artery disease)   . GERD (gastroesophageal reflux disease)   . High cholesterol   . Hypothyroidism   . Myocardial infarction ([place]) 09/2016  . Nephrolithiasis   . Thyroid disease    Past Surgical History:  Procedure Laterality Date  . CORONARY STENT INTERVENTION N/A 10/12/2016   Procedure: CORONARY STENT INTERVENTION;  Surgeon: Yolonda Kida, MD;  Location: [facility];  Service: Cardiovascular;  Laterality: N/A;  . CYSTOSCOPY W/ RETROGRADES Bilateral 12/16/2017   Procedure: CYSTOSCOPY WITH RETROGRADE PYELOGRAM;  Surgeon: Hollice Espy, MD;  Location: [facility];  Service: Urology;  Laterality: Bilateral;  . CYSTOSCOPY WITH FULGERATION  12/16/2017   Procedure: CYSTOSCOPY WITH FULGERATION of bleeding prostate;  Surgeon: Hollice Espy, MD;  Location: [facility];  Service: Urology;;  . [name] WITH URETHRAL DILATATION N/A 12/16/2017   Procedure: CYSTOSCOPY WITH URETHRAL DILATATION;  Surgeon: Hollice Espy, MD;  Location: [facility];  Service: Urology;  Laterality: N/A;  . HERNIA REPAIR    . LEFT HEART CATH AND CORONARY ANGIOGRAPHY N/A 10/12/2016   Procedure: LEFT HEART CATH AND CORONARY ANGIOGRAPHY and possible pci;  Surgeon: Yolonda Kida, MD;  Location: [facility] [place] [facility];  Service: Cardiovascular;  Laterality: N/A;     A IV Location/Drains/Wounds Patient Lines/Drains/Airways Status   Active Line/Drains/Airways    Name:   Placement date:   Placement time:   Site:   Days:   Peripheral IV 04/04/18 Left Antecubital   04/04/18    2315    Antecubital   1   Peripheral IV 04/05/18 Left Forearm   04/05/18    0003    Forearm   less than 1          Intake/Output Last 24 hours  Intake/Output Summary (Last 24 hours) at 04/05/2018 0402 Last data filed at 04/05/2018 0346 Gross per 24 hour  Intake 95.93 ml  Output -  Net  95.93 ml    Labs/Imaging Results for orders placed or performed during the hospital encounter of 04/04/18 (from the past 48 hour(s))  Comprehensive metabolic panel     Status: Abnormal   Collection Time: 04/04/18 10:55 PM  Result Value Ref Range   Sodium 140 135 - 145 mmol/L   Potassium 3.9 3.5 - 5.1 mmol/L   Chloride 107 98 - 111 mmol/L   CO2 24 22 - 32 mmol/L   Glucose, Bld 112 (H) 70 - 99 mg/dL   BUN 22 8 - 23 mg/dL   Creatinine, Ser 1.68 (H) 0.61 - 1.24 mg/dL   Calcium 9.1 8.9 -  10.3 mg/dL   Total Protein 7.0 6.5 - 8.1 g/dL   Albumin 3.8 3.5 - 5.0 g/dL   AST 41 15 - 41 U/L   ALT 20 0 - 44 U/L   Alkaline Phosphatase 42 38 - 126 U/L   Total Bilirubin 0.4 0.3 - 1.2 mg/dL   GFR calc non Af Amer 39 (L) >60 mL/min   GFR calc Af Amer 45 (L) >60 mL/min   Anion gap 9 5 - 15    Comment: Performed at [facility], [address] [place], [place] [address]  Acetaminophen level     Status: Abnormal   Collection Time: 04/04/18 10:55 PM  Result Value Ref Range   Acetaminophen (Tylenol), Serum <10 (L) 10 - 30 ug/mL    Comment: (NOTE) Therapeutic concentrations vary significantly. A range of 10-30 ug/mL  may be an effective concentration for many patients. However, some  are best treated at concentrations outside of this range. Acetaminophen concentrations >150 ug/mL at 4 hours after ingestion  and >50 ug/mL at 12 hours after ingestion are often associated with  toxic reactions. Performed at [facility], [address] [place], [place] [address]   Ethanol     Status: None   Collection Time: 04/04/18 10:55 PM  Result Value Ref Range   Alcohol, Ethyl (B) <10 <10 mg/dL    Comment: (NOTE) Lowest detectable limit for serum alcohol is 10 mg/dL. For medical purposes only. Performed at [facility], [address] [place], [place] [address]   Salicylate level     Status: None   Collection Time: 04/04/18 10:55 PM  Result Value Ref Range   Salicylate Lvl <5.4 2.8 - 30.0 mg/dL    Comment: Performed at [facility], [address] [place], [place] [address]  Troponin I - Once     Status: Abnormal   Collection Time: 04/04/18 10:55 PM  Result Value Ref Range   Troponin I 0.34 (HH) <0.03 ng/mL    Comment: CRITICAL RESULT CALLED TO, READ BACK BY AND VERIFIED WITH GRACIE [facility] AT 2330 04/04/2018.  TFK Performed at [facility], [address] [place], [place] [address]   CBC with Differential     Status: None    Collection Time: 04/04/18 10:55 PM  Result Value Ref Range   WBC 7.2 4.0 - 10.5 K/uL   RBC 4.51 4.22 - 5.81 MIL/uL   Hemoglobin 13.0 13.0 - 17.0 g/dL   HCT 41.3 39.0 - 52.0 %   MCV 91.6 80.0 - 100.0 fL   MCH 28.8 26.0 - 34.0 pg   MCHC 31.5 30.0 - 36.0 g/dL   RDW 13.4 11.5 - 15.5 %   Platelets 237 150 - 400 K/uL   nRBC 0.0 0.0 - 0.2 %   Neutrophils Relative % 73 %   Neutro Abs 5.2  1.7 - 7.7 K/uL   Lymphocytes Relative 14 %   Lymphs Abs 1.0 0.7 - 4.0 K/uL   Monocytes Relative 10 %   Monocytes Absolute 0.7 0.1 - 1.0 K/uL   Eosinophils Relative 2 %   Eosinophils Absolute 0.2 0.0 - 0.5 K/uL   Basophils Relative 1 %   Basophils Absolute 0.1 0.0 - 0.1 K/uL   Immature Granulocytes 0 %   Abs Immature Granulocytes 0.01 0.00 - 0.07 K/uL    Comment: Performed at [facility], [address] [place], [place] [address]  APTT     Status: None   Collection Time: 04/04/18 11:53 PM  Result Value Ref Range   aPTT 30 24 - 36 seconds    Comment: Performed at [facility], [address] [place], [place] [address]  Protime-INR     Status: None   Collection Time: 04/04/18 11:53 PM  Result Value Ref Range   Prothrombin Time 14.1 11.4 - 15.2 seconds   INR 1.1 0.8 - 1.2    Comment: (NOTE) INR goal varies based on device and disease states. Performed at [facility], [address] [place],  [address]   Urinalysis, Complete w Microscopic     Status: Abnormal   Collection Time: 04/05/18 12:00 AM  Result Value Ref Range   Color, Urine YELLOW (A) YELLOW   APPearance CLEAR (A) CLEAR   Specific Gravity, Urine 1.016 1.005 - 1.030   pH 6.0 5.0 - 8.0   Glucose, UA NEGATIVE NEGATIVE mg/dL   Hgb urine dipstick SMALL (A) NEGATIVE   Bilirubin Urine NEGATIVE NEGATIVE   Ketones, ur NEGATIVE NEGATIVE mg/dL   Protein, ur NEGATIVE NEGATIVE mg/dL   Nitrite NEGATIVE NEGATIVE   Leukocytes,Ua TRACE (A) NEGATIVE   RBC / HPF 0-5 0 - 5 RBC/hpf   WBC, UA 0-5 0 - 5 WBC/hpf    Bacteria, UA NONE SEEN NONE SEEN   Squamous Epithelial / LPF NONE SEEN 0 - 5    Comment: Performed at [facility], [address] [place],  [address]  Urine Drug Screen, Qualitative     Status: None   Collection Time: 04/05/18 12:00 AM  Result Value Ref Range   Tricyclic, Ur Screen NONE DETECTED NONE DETECTED   Amphetamines, Ur Screen NONE DETECTED NONE DETECTED   MDMA (Ecstasy)Ur Screen NONE DETECTED NONE DETECTED   Cocaine Metabolite,Ur [place] NONE DETECTED NONE DETECTED   Opiate, Ur Screen NONE DETECTED NONE DETECTED   Phencyclidine (PCP) Ur S NONE DETECTED NONE DETECTED   Cannabinoid 50 Ng, Ur [place] NONE DETECTED NONE DETECTED   Barbiturates, Ur Screen NONE DETECTED NONE DETECTED   Benzodiazepine, Ur Scrn NONE DETECTED NONE DETECTED   Methadone Scn, Ur NONE DETECTED NONE DETECTED    Comment: (NOTE) Tricyclics + metabolites, urine    Cutoff 1000 ng/mL Amphetamines + metabolites, urine  Cutoff 1000 ng/mL MDMA (Ecstasy), urine              Cutoff 500 ng/mL Cocaine Metabolite, urine          Cutoff 300 ng/mL Opiate + metabolites, urine        Cutoff 300 ng/mL Phencyclidine (PCP), urine         Cutoff 25 ng/mL Cannabinoid, urine                 Cutoff 50 ng/mL Barbiturates + metabolites, urine  Cutoff 200 ng/mL Benzodiazepine, urine  Cutoff 200 ng/mL Methadone, urine                   Cutoff 300 ng/mL The urine drug screen provides only a preliminary, unconfirmed analytical test result and should not be used for non-medical purposes. Clinical consideration and professional judgment should be applied to any positive drug screen result due to possible interfering substances. A more specific alternate chemical method must be used in order to obtain a confirmed analytical result. Gas chromatography / mass spectrometry (GC/MS) is the preferred confirmat ory method. Performed at [facility], [address] [place], [place] [address]    Ct Head  Wo Contrast  Result Date: 04/04/2018 CLINICAL DATA:  Altered level of consciousness. EXAM: CT HEAD WITHOUT CONTRAST TECHNIQUE: Contiguous axial images were obtained from the base of the skull through the vertex without intravenous contrast. COMPARISON:  None. FINDINGS: BRAIN: No intraparenchymal hemorrhage, mass effect nor midline shift. No parenchymal brain volume loss for age. No hydrocephalus. Minimal supratentorial white matter hypodensities less than expected for patient's age, though non-specific are most compatible with chronic small vessel ischemic disease. No acute large vascular territory infarcts. No abnormal extra-axial fluid collections. Basal cisterns are patent. VASCULAR: Mild calcific atherosclerosis of the carotid siphons. SKULL: No skull fracture. No significant scalp soft tissue swelling. SINUSES/ORBITS: Trace paranasal sinus mucosal thickening. Mastoid air cells are well aerated.The included ocular globes and orbital contents are non-suspicious. OTHER: None. IMPRESSION: Negative non-contrast CT HEAD for age. Electronically Signed   By: Elon Alas M.D.   On: 04/04/2018 22:36   Dg Chest Portable 1 View  Result Date: 04/04/2018 CLINICAL DATA:  Confusion and anxiety beginning today. EXAM: PORTABLE CHEST 1 VIEW COMPARISON:  10/10/2016 FINDINGS: Borderline cardiomegaly. Aortic atherosclerosis without aneurysm. No pulmonary consolidation, effusion or pneumothorax. IMPRESSION: No active disease.  Aortic atherosclerosis. Electronically Signed   By: Ashley Royalty M.D.   On: 04/04/2018 23:01    Pending Labs Unresulted Labs (From admission, onward)    Start     Ordered   04/05/18 0800  Heparin level (unfractionated)  Once-Timed,   STAT     04/05/18 0024   Signed and Held  TSH  Add-on,   R     Signed and Held          Vitals/Pain Today's Vitals   04/04/18 2230 04/04/18 2245 04/05/18 0354 04/05/18 0400  BP: 127/74  (!) 144/76 (!) 131/92  Pulse: 84 90 72 72  Resp: 16 (!) 25 17 16    Temp:      TempSrc:      SpO2: 96% 97% 99% 93%  Weight:      PainSc:        Isolation Precautions No active isolations  Medications Medications  heparin ADULT infusion 100 units/mL (25000 units/289mL sodium chloride 0.45%) (1,100 Units/hr Intravenous New Bag/Given 04/05/18 0006)  aspirin chewable tablet 324 mg (324 mg Oral Given 04/04/18 2340)  heparin injection 4,000 Units (4,000 Units Intravenous Given 04/04/18 2341)  cefTRIAXone (ROCEPHIN) 1 g in sodium chloride 0.9 % 100 mL IVPB (0 g Intravenous Stopped 04/05/18 0346)    Mobility walks Low fall risk   Focused Assessments Cardiac Assessment Handoff:    Lab Results  Component Value Date   TROPONINI 0.34 ([place]) 04/04/2018   No results found for: DDIMER Does the Patient currently have chest pain? No     R Recommendations: See Admitting Provider Note  Report given to:   Additional Notes:  Pt states that bugs are  under his skin in the cold and go back out when it is hot. Pt states that he does not want daughter to know what is going on other than just saying that he is stable. Pt has given permission to pastor to talk to the daughter and let her know what is going on.

## 2018-04-05 NOTE — Plan of Care (Signed)
Spoke with pt's daughter [name], she states he was being treated for a UTI outpt, and wanted to make sure we did a UA on pt- UA negative, daughter states there were termites in the house the pt was staying in in December, and just recently pt has brought up that there has been bugs in his blood stream this week, pt's daughter would like psych to see pt... expressed this to Dr. Marcille [place] & psych consult was placed. Pt is A&O though. Problem: Clinical Measurements: Goal: Respiratory complications will improve Outcome: Progressing Note:  Room air   Problem: Activity: Goal: Risk for activity intolerance will decrease Outcome: Progressing Note:  Standby assist in room   Problem: Elimination: Goal: Will not experience complications related to urinary retention Outcome: Progressing   Problem: Pain Managment: Goal: General experience of comfort will improve Outcome: Progressing Note:  No complaints of pain   Problem: Safety: Goal: Ability to remain free from injury will improve Outcome: Progressing

## 2018-04-05 NOTE — H&P (Signed)
Christopher Carlson is an 78 y.o. male.   Chief Complaint: Insect bites HPI: The patient with past medical history of CAD status post MI and PCI, hypothyroidism and BPH presents to the emergency department complaining of insect bites.  The patient reports staying in a motel briefly and may have been exposed to bedbugs.  Further questioning reveals the patient may be hallucinating the infestation as he reports itching more with warm weather and the bugs retreating into his skin when it is cold.  Initially it was planned for the patient to be seen by behavioral health laboratory evaluation revealed elevated troponin.  Given his history and risk factors for heart disease the emergency department started a heparin drip prior to calling the hospitalist service for admission.  Past Medical History:  Diagnosis Date  . BPH (benign prostatic hyperplasia)   . CAD (coronary artery disease)   . GERD (gastroesophageal reflux disease)   . High cholesterol   . Hypothyroidism   . Myocardial infarction ([place]) 09/2016  . Nephrolithiasis   . Thyroid disease     Past Surgical History:  Procedure Laterality Date  . CORONARY STENT INTERVENTION N/A 10/12/2016   Procedure: CORONARY STENT INTERVENTION;  Surgeon: Yolonda Kida, MD;  Location: [facility];  Service: Cardiovascular;  Laterality: N/A;  . CYSTOSCOPY W/ RETROGRADES Bilateral 12/16/2017   Procedure: CYSTOSCOPY WITH RETROGRADE PYELOGRAM;  Surgeon: Hollice Espy, MD;  Location: [facility];  Service: Urology;  Laterality: Bilateral;  . CYSTOSCOPY WITH FULGERATION  12/16/2017   Procedure: CYSTOSCOPY WITH FULGERATION of bleeding prostate;  Surgeon: Hollice Espy, MD;  Location: [facility];  Service: Urology;;  . [name] WITH URETHRAL DILATATION N/A 12/16/2017   Procedure: CYSTOSCOPY WITH URETHRAL DILATATION;  Surgeon: Hollice Espy, MD;  Location: [facility];  Service: Urology;  Laterality: N/A;  . HERNIA REPAIR    . LEFT HEART CATH AND CORONARY  ANGIOGRAPHY N/A 10/12/2016   Procedure: LEFT HEART CATH AND CORONARY ANGIOGRAPHY and possible pci;  Surgeon: Yolonda Kida, MD;  Location: [facility];  Service: Cardiovascular;  Laterality: N/A;    Family History  Problem Relation Age of Onset  . Cancer Father   . Alcohol abuse Father   . Prostate cancer Neg Hx   . Bladder Cancer Neg Hx   . Kidney cancer Neg Hx    Social History:  reports that he has quit smoking. His smoking use included cigarettes. He has a 50.00 pack-year smoking history. He has never used smokeless tobacco. He reports that he does not drink alcohol or use drugs.  Allergies:  Allergies  Allergen Reactions  . Ciprofloxacin Rash    Medications Prior to Admission  Medication Sig Dispense Refill  . aspirin EC 81 MG tablet Take 81 mg by mouth daily.    Marland Kitchen atorvastatin (LIPITOR) 40 MG tablet Take 40 mg by mouth at bedtime.     . cetirizine (ZYRTEC) 10 MG tablet Take 10 mg by mouth daily.    . clonazePAM (KLONOPIN) 0.5 MG tablet Take 0.5 mg by mouth at bedtime.     . finasteride (PROSCAR) 5 MG tablet Take 5 mg by mouth at bedtime.     Marland Kitchen levothyroxine (SYNTHROID, LEVOTHROID) 112 MCG tablet Take 112 mcg by mouth daily before breakfast.     . nitroGLYCERIN (NITROSTAT) 0.4 MG SL tablet Place 1 tablet (0.4 mg total) under the tongue every 5 (five) minutes as needed for chest pain. 30 tablet 0  . pantoprazole (PROTONIX) 40 MG tablet Take  40 mg by mouth daily.    . QUEtiapine (SEROQUEL) 50 MG tablet Take 50 mg by mouth at bedtime.    . sulfamethoxazole-trimethoprim (BACTRIM DS,SEPTRA DS) 800-160 MG tablet Take 1 tablet by mouth 2 (two) times daily.    . tamsulosin (FLOMAX) 0.4 MG CAPS capsule Take 1 capsule (0.4 mg total) by mouth daily. 90 capsule 3  . triamcinolone cream (KENALOG) 0.1 % Apply 1 application topically 2 (two) times daily.      Results for orders placed or performed during the hospital encounter of 04/04/18 (from the past 48 hour(s))   Comprehensive metabolic panel     Status: Abnormal   Collection Time: 04/04/18 10:55 PM  Result Value Ref Range   Sodium 140 135 - 145 mmol/L   Potassium 3.9 3.5 - 5.1 mmol/L   Chloride 107 98 - 111 mmol/L   CO2 24 22 - 32 mmol/L   Glucose, Bld 112 (H) 70 - 99 mg/dL   BUN 22 8 - 23 mg/dL   Creatinine, Ser 1.68 (H) 0.61 - 1.24 mg/dL   Calcium 9.1 8.9 - 10.3 mg/dL   Total Protein 7.0 6.5 - 8.1 g/dL   Albumin 3.8 3.5 - 5.0 g/dL   AST 41 15 - 41 U/L   ALT 20 0 - 44 U/L   Alkaline Phosphatase 42 38 - 126 U/L   Total Bilirubin 0.4 0.3 - 1.2 mg/dL   GFR calc non Af Amer 39 (L) >60 mL/min   GFR calc Af Amer 45 (L) >60 mL/min   Anion gap 9 5 - 15    Comment: Performed at [facility], [address] [place], [place] [address]  Acetaminophen level     Status: Abnormal   Collection Time: 04/04/18 10:55 PM  Result Value Ref Range   Acetaminophen (Tylenol), Serum <10 (L) 10 - 30 ug/mL    Comment: (NOTE) Therapeutic concentrations vary significantly. A range of 10-30 ug/mL  may be an effective concentration for many patients. However, some  are best treated at concentrations outside of this range. Acetaminophen concentrations >150 ug/mL at 4 hours after ingestion  and >50 ug/mL at 12 hours after ingestion are often associated with  toxic reactions. Performed at [facility], [address] [place], [place] [address]   Ethanol     Status: None   Collection Time: 04/04/18 10:55 PM  Result Value Ref Range   Alcohol, Ethyl (B) <10 <10 mg/dL    Comment: (NOTE) Lowest detectable limit for serum alcohol is 10 mg/dL. For medical purposes only. Performed at [facility], [address] [place], [place] [address]   Salicylate level     Status: None   Collection Time: 04/04/18 10:55 PM  Result Value Ref Range   Salicylate Lvl <2.6 2.8 - 30.0 mg/dL    Comment: Performed at [facility], [address] [place], [place] [address]  Troponin I - Once      Status: Abnormal   Collection Time: 04/04/18 10:55 PM  Result Value Ref Range   Troponin I 0.34 (HH) <0.03 ng/mL    Comment: CRITICAL RESULT CALLED TO, READ BACK BY AND VERIFIED WITH [facility] AT 2330 04/04/2018.  TFK Performed at [facility], [address] [place], [place] [address]   CBC with Differential     Status: None   Collection Time: 04/04/18 10:55 PM  Result Value Ref Range   WBC 7.2 4.0 - 10.5 K/uL   RBC 4.51 4.22 - 5.81  MIL/uL   Hemoglobin 13.0 13.0 - 17.0 g/dL   HCT 41.3 39.0 - 52.0 %   MCV 91.6 80.0 - 100.0 fL   MCH 28.8 26.0 - 34.0 pg   MCHC 31.5 30.0 - 36.0 g/dL   RDW 13.4 11.5 - 15.5 %   Platelets 237 150 - 400 K/uL   nRBC 0.0 0.0 - 0.2 %   Neutrophils Relative % 73 %   Neutro Abs 5.2 1.7 - 7.7 K/uL   Lymphocytes Relative 14 %   Lymphs Abs 1.0 0.7 - 4.0 K/uL   Monocytes Relative 10 %   Monocytes Absolute 0.7 0.1 - 1.0 K/uL   Eosinophils Relative 2 %   Eosinophils Absolute 0.2 0.0 - 0.5 K/uL   Basophils Relative 1 %   Basophils Absolute 0.1 0.0 - 0.1 K/uL   Immature Granulocytes 0 %   Abs Immature Granulocytes 0.01 0.00 - 0.07 K/uL    Comment: Performed at [facility] [address] [place], [place] [address]  APTT     Status: None   Collection Time: 04/04/18 11:53 PM  Result Value Ref Range   aPTT 30 24 - 36 seconds    Comment: Performed at [facility], [address] [place], [place] [address]  Protime-INR     Status: None   Collection Time: 04/04/18 11:53 PM  Result Value Ref Range   Prothrombin Time 14.1 11.4 - 15.2 seconds   INR 1.1 0.8 - 1.2    Comment: (NOTE) INR goal varies based on device and disease states. Performed at [facility], [address] [place], [place] [address]   Urinalysis, Complete w Microscopic     Status: Abnormal   Collection Time: 04/05/18 12:00 AM  Result Value Ref Range   Color, Urine YELLOW (A) YELLOW   APPearance CLEAR (A) CLEAR   Specific Gravity, Urine  1.016 1.005 - 1.030   pH 6.0 5.0 - 8.0   Glucose, UA NEGATIVE NEGATIVE mg/dL   Hgb urine dipstick SMALL (A) NEGATIVE   Bilirubin Urine NEGATIVE NEGATIVE   Ketones, ur NEGATIVE NEGATIVE mg/dL   Protein, ur NEGATIVE NEGATIVE mg/dL   Nitrite NEGATIVE NEGATIVE   Leukocytes,Ua TRACE (A) NEGATIVE   RBC / HPF 0-5 0 - 5 RBC/hpf   WBC, UA 0-5 0 - 5 WBC/hpf   Bacteria, UA NONE SEEN NONE SEEN   Squamous Epithelial / LPF NONE SEEN 0 - 5    Comment: Performed at [facility], [address] [place], [place] [address]  Urine Drug Screen, Qualitative     Status: None   Collection Time: 04/05/18 12:00 AM  Result Value Ref Range   Tricyclic, Ur Screen NONE DETECTED NONE DETECTED   Amphetamines, Ur Screen NONE DETECTED NONE DETECTED   MDMA (Ecstasy)Ur Screen NONE DETECTED NONE DETECTED   Cocaine Metabolite,Ur [place] NONE DETECTED NONE DETECTED   Opiate, Ur Screen NONE DETECTED NONE DETECTED   Phencyclidine (PCP) Ur S NONE DETECTED NONE DETECTED   Cannabinoid 50 Ng, Ur [place] NONE DETECTED NONE DETECTED   Barbiturates, Ur Screen NONE DETECTED NONE DETECTED   Benzodiazepine, Ur Scrn NONE DETECTED NONE DETECTED   Methadone Scn, Ur NONE DETECTED NONE DETECTED    Comment: (NOTE) Tricyclics + metabolites, urine    Cutoff 1000 ng/mL Amphetamines + metabolites, urine  Cutoff 1000 ng/mL MDMA (Ecstasy), urine              Cutoff 500 ng/mL Cocaine Metabolite, urine  Cutoff 300 ng/mL Opiate + metabolites, urine        Cutoff 300 ng/mL Phencyclidine (PCP), urine         Cutoff 25 ng/mL Cannabinoid, urine                 Cutoff 50 ng/mL Barbiturates + metabolites, urine  Cutoff 200 ng/mL Benzodiazepine, urine              Cutoff 200 ng/mL Methadone, urine                   Cutoff 300 ng/mL The urine drug screen provides only a preliminary, unconfirmed analytical test result and should not be used for non-medical purposes. Clinical consideration and professional judgment should be applied to any  positive drug screen result due to possible interfering substances. A more specific alternate chemical method must be used in order to obtain a confirmed analytical result. Gas chromatography / mass spectrometry (GC/MS) is the preferred confirmat ory method. Performed at [facility], [address] [place], [place] [address]    Ct Head Wo Contrast  Result Date: 04/04/2018 CLINICAL DATA:  Altered level of consciousness. EXAM: CT HEAD WITHOUT CONTRAST TECHNIQUE: Contiguous axial images were obtained from the base of the skull through the vertex without intravenous contrast. COMPARISON:  None. FINDINGS: BRAIN: No intraparenchymal hemorrhage, mass effect nor midline shift. No parenchymal brain volume loss for age. No hydrocephalus. Minimal supratentorial white matter hypodensities less than expected for patient's age, though non-specific are most compatible with chronic small vessel ischemic disease. No acute large vascular territory infarcts. No abnormal extra-axial fluid collections. Basal cisterns are patent. VASCULAR: Mild calcific atherosclerosis of the carotid siphons. SKULL: No skull fracture. No significant scalp soft tissue swelling. SINUSES/ORBITS: Trace paranasal sinus mucosal thickening. Mastoid air cells are well aerated.The included ocular globes and orbital contents are non-suspicious. OTHER: None. IMPRESSION: Negative non-contrast CT HEAD for age. Electronically Signed   By: Elon Alas M.D.   On: 04/04/2018 22:36   Dg Chest Portable 1 View  Result Date: 04/04/2018 CLINICAL DATA:  Confusion and anxiety beginning today. EXAM: PORTABLE CHEST 1 VIEW COMPARISON:  10/10/2016 FINDINGS: Borderline cardiomegaly. Aortic atherosclerosis without aneurysm. No pulmonary consolidation, effusion or pneumothorax. IMPRESSION: No active disease.  Aortic atherosclerosis. Electronically Signed   By: Ashley Royalty M.D.   On: 04/04/2018 23:01    Review of Systems  Constitutional: Negative for  chills and fever.  HENT: Negative for sore throat and tinnitus.   Eyes: Negative for blurred vision and redness.  Respiratory: Negative for cough and shortness of breath.   Cardiovascular: Positive for chest pain. Negative for palpitations, orthopnea and PND.  Gastrointestinal: Negative for abdominal pain, diarrhea, nausea and vomiting.  Genitourinary: Negative for dysuria, frequency and urgency.  Musculoskeletal: Negative for joint pain and myalgias.  Skin: Negative for rash.       No lesions  Neurological: Negative for speech change, focal weakness and weakness.  Endo/Heme/Allergies: Does not bruise/bleed easily.       No temperature intolerance  Psychiatric/Behavioral: Positive for hallucinations. Negative for depression and suicidal ideas. The patient is nervous/anxious.     Blood pressure (!) 147/80, pulse 74, temperature 98.3 F (36.8 C), temperature source Axillary, resp. rate 20, height 6' (1.829 m), weight 85.9 kg, SpO2 100 %. Physical Exam  Vitals reviewed. Constitutional: He is oriented to person, place, and time. He appears well-developed and well-nourished. No distress.  HENT:  Head: Normocephalic and atraumatic.  Mouth/Throat: Oropharynx is clear  and moist.  Eyes: Pupils are equal, round, and reactive to light. Conjunctivae and EOM are normal. No scleral icterus.  Neck: Normal range of motion. Neck supple. No JVD present. No tracheal deviation present. No thyromegaly present.  Cardiovascular: Normal rate, regular rhythm and normal heart sounds. Exam reveals no gallop and no friction rub.  No murmur heard. Respiratory: Effort normal and breath sounds normal. No respiratory distress.  GI: Soft. Bowel sounds are normal. He exhibits no distension. There is no abdominal tenderness.  Genitourinary:    Genitourinary Comments: Deferred   Musculoskeletal: Normal range of motion.        General: No edema.  Lymphadenopathy:    He has no cervical adenopathy.  Neurological: He is  alert and oriented to person, place, and time. No cranial nerve deficit.  Skin: Skin is warm and dry. No rash noted. There is erythema (excoriations).  Psychiatric: He has a normal mood and affect. Judgment and thought content normal. He is actively hallucinating.     Assessment/Plan This is a 78 year old male admitted for NSTEMI. 1.  NSTEMI: Continue heparin drip.  Monitor telemetry.  Follow cardiac biomarkers.  Consult cardiology. 2.  CAD: Plan as above.  Continue aspirin.  The patient is chest pain-free.  Initially he denied chest pain but he now admits to feeling a slight tightness in his upper chest much earlier this evening. 3.  Hypothyroidism: Continue Synthroid 4.  BPH: Continue Proscar and Flomax 5.  DVT prophylaxis: Therapeutic anticoagulation 6.  GI prophylaxis: Pantoprazole per home regimen The patient is a full code.  Time spent on admission orders and patient care approximately 45 minutes  Harrie Foreman, MD 04/05/2018, 5:33 AM

## 2018-04-05 NOTE — Consult Note (Signed)
Cardiology Consultation Note    Patient ID: Christopher Carlson, MRN: [id number], DOB/AGE: 1941-01-03 78 y.o. Admit date: 04/04/2018   Date of Consult: 04/05/2018 Primary Physician: Maryland Pink, MD Primary Cardiologist: Dr. Clayborn Bigness    Chief Complaint: itching all over his body and seeing bugs under his skin Reason for Consultation: abnormal troponin Requesting MD: Dr. Jannifer Franklin   HPI: Christopher Carlson is a 78 y.o. male with history of coronary artery disease status post cardiac catheterization in September 2018 revealing left ventricular ejection fraction is 35-45% by visual estimate.  He had a proximal to mid 90% LAD stenosis.  He underwent placement of a  SIERRA 3.00 X 18 MM drug eluting stent was successfully placed in the LAD, which does not overlap a previously placed stent.  The RCA was unremarkable.  He had a 1st diag lesion, 50 %stenosed.  He was last seen in July 2019 in his cardiologist office.  He was treated for a DVT and pulmonary embolus with Eliquis.  This is been subsequently discontinued.  He continues with Plavix alone.  He also has hypothyroidism treated with levothyroxine and high intensity atorvastatin.  He was staying in a hotel while work was being done on his house.  He states he felt itching over his entire body and states he saw bugs jumping off of his skin and under his skin.  He was moved to a different room and had this problem.  He became quite agitated and was moved out of his room.  He presented to the emergency room where he was actively hallucinating.  Protocol labs were drawn including troponin which was mildly elevated at 0.34.  He was started on heparin based on this.  Hemoglobin is normal.  Renal function showed acute on chronic renal insufficiency with a creatinine 1.68 up from 1.353 months ago.  He currently denies chest pain.  Chest x-ray showed no acute cardiopulmonary disease.  Brain CT was negative.  He is currently hemodynamically stable.  Past Medical  History:  Diagnosis Date  . BPH (benign prostatic hyperplasia)   . CAD (coronary artery disease)   . GERD (gastroesophageal reflux disease)   . High cholesterol   . Hypothyroidism   . Myocardial infarction ([place]) 09/2016  . Nephrolithiasis   . Thyroid disease       Surgical History:  Past Surgical History:  Procedure Laterality Date  . CORONARY STENT INTERVENTION N/A 10/12/2016   Procedure: CORONARY STENT INTERVENTION;  Surgeon: Yolonda Kida, MD;  Location: [facility];  Service: Cardiovascular;  Laterality: N/A;  . CYSTOSCOPY W/ RETROGRADES Bilateral 12/16/2017   Procedure: CYSTOSCOPY WITH RETROGRADE PYELOGRAM;  Surgeon: Hollice Espy, MD;  Location: [facility];  Service: Urology;  Laterality: Bilateral;  . CYSTOSCOPY WITH FULGERATION  12/16/2017   Procedure: CYSTOSCOPY WITH FULGERATION of bleeding prostate;  Surgeon: Hollice Espy, MD;  Location: [facility];  Service: Urology;;  . [name] WITH URETHRAL DILATATION N/A 12/16/2017   Procedure: CYSTOSCOPY WITH URETHRAL DILATATION;  Surgeon: Hollice Espy, MD;  Location: [facility];  Service: Urology;  Laterality: N/A;  . HERNIA REPAIR    . LEFT HEART CATH AND CORONARY ANGIOGRAPHY N/A 10/12/2016   Procedure: LEFT HEART CATH AND CORONARY ANGIOGRAPHY and possible pci;  Surgeon: Yolonda Kida, MD;  Location: [facility];  Service: Cardiovascular;  Laterality: N/A;     Home Meds: Prior to Admission medications   Medication Sig Start Date End Date Taking? Authorizing Provider  aspirin EC 81 MG tablet  Take 81 mg by mouth daily.   Yes [provider]  atorvastatin (LIPITOR) 40 MG tablet Take 40 mg by mouth at bedtime.  07/03/16  Yes [provider]  cetirizine (ZYRTEC) 10 MG tablet Take 10 mg by mouth daily. 02/27/18 02/27/19 Yes [provider]  clonazePAM (KLONOPIN) 0.5 MG tablet Take 0.5 mg by mouth at bedtime.  09/10/16  Yes [provider]  finasteride (PROSCAR) 5 MG tablet  Take 5 mg by mouth at bedtime.    Yes [provider]  levothyroxine (SYNTHROID, LEVOTHROID) 112 MCG tablet Take 112 mcg by mouth daily before breakfast.  07/03/16  Yes [provider]  nitroGLYCERIN (NITROSTAT) 0.4 MG SL tablet Place 1 tablet (0.4 mg total) under the tongue every 5 (five) minutes as needed for chest pain. 10/13/16  Yes Mody, Ulice Bold, MD  pantoprazole (PROTONIX) 40 MG tablet Take 40 mg by mouth daily. 07/03/16  Yes [provider]  QUEtiapine (SEROQUEL) 50 MG tablet Take 50 mg by mouth at bedtime. 07/03/16  Yes [provider]  sulfamethoxazole-trimethoprim (BACTRIM DS,SEPTRA DS) 800-160 MG tablet Take 1 tablet by mouth 2 (two) times daily.   Yes [provider]  tamsulosin (FLOMAX) 0.4 MG CAPS capsule Take 1 capsule (0.4 mg total) by mouth daily. 12/25/17  Yes McGowan, Larene Beach A, PA-C  triamcinolone cream (KENALOG) 0.1 % Apply 1 application topically 2 (two) times daily. 04/04/18 04/04/19 Yes [provider]    Inpatient Medications:  . aspirin EC  81 mg Oral Daily  . atorvastatin  40 mg Oral QHS  . clonazePAM  0.5 mg Oral QHS  . docusate sodium  100 mg Oral BID  . finasteride  5 mg Oral QHS  . levothyroxine  112 mcg Oral QAC breakfast  . loratadine  10 mg Oral Daily  . pantoprazole  40 mg Oral Daily  . QUEtiapine  50 mg Oral QHS  . sulfamethoxazole-trimethoprim  1 tablet Oral BID  . tamsulosin  0.4 mg Oral Daily  . triamcinolone cream  1 application Topical BID   . heparin 1,100 Units/hr (04/05/18 0734)    Allergies:  Allergies  Allergen Reactions  . Ciprofloxacin Rash    Social History   Socioeconomic History  . Marital status: Married    Spouse name: Not on file  . Number of children: Not on file  . Years of education: Not on file  . Highest education level: Not on file  Occupational History  . Not on file  Social Needs  . Financial resource strain: Not on file  . Food insecurity:    Worry: Not on file     Inability: Not on file  . Transportation needs:    Medical: Not on file    Non-medical: Not on file  Tobacco Use  . Smoking status: Former Smoker    Packs/day: 2.50    Years: 20.00    Pack years: 50.00    Types: Cigarettes  . Smokeless tobacco: Never Used  Substance and Sexual Activity  . Alcohol use: No  . Drug use: No  . Sexual activity: Not on file  Lifestyle  . Physical activity:    Days per week: Not on file    Minutes per session: Not on file  . Stress: Not on file  Relationships  . Social connections:    Talks on phone: Not on file    Gets together: Not on file    Attends religious service: Not on file    Active member  of club or organization: Not on file    Attends meetings of clubs or organizations: Not on file    Relationship status: Not on file  . Intimate partner violence:    Fear of current or ex partner: Not on file    Emotionally abused: Not on file    Physically abused: Not on file    Forced sexual activity: Not on file  Other Topics Concern  . Not on file  Social History Narrative  . Not on file     Family History  Problem Relation Age of Onset  . Cancer Father   . Alcohol abuse Father   . Prostate cancer Neg Hx   . Bladder Cancer Neg Hx   . Kidney cancer Neg Hx      Review of Systems: A 12-system review of systems was performed and is negative except as noted in the HPI.  Labs: Recent Labs    04/04/18 2255  TROPONINI 0.34*   Lab Results  Component Value Date   WBC 7.2 04/04/2018   HGB 13.0 04/04/2018   HCT 41.3 04/04/2018   MCV 91.6 04/04/2018   PLT 237 04/04/2018    Recent Labs  Lab 04/04/18 2255  NA 140  K 3.9  CL 107  CO2 24  BUN 22  CREATININE 1.68*  CALCIUM 9.1  PROT 7.0  BILITOT 0.4  ALKPHOS 42  ALT 20  AST 41  GLUCOSE 112*   Lab Results  Component Value Date   CHOL 122 10/11/2016   HDL 28 (L) 10/11/2016   LDLCALC 67 10/11/2016   TRIG 133 10/11/2016   No results found for: DDIMER  Radiology/Studies:   Ct Head Wo Contrast  Result Date: 04/04/2018 CLINICAL DATA:  Altered level of consciousness. EXAM: CT HEAD WITHOUT CONTRAST TECHNIQUE: Contiguous axial images were obtained from the base of the skull through the vertex without intravenous contrast. COMPARISON:  None. FINDINGS: BRAIN: No intraparenchymal hemorrhage, mass effect nor midline shift. No parenchymal brain volume loss for age. No hydrocephalus. Minimal supratentorial white matter hypodensities less than expected for patient's age, though non-specific are most compatible with chronic small vessel ischemic disease. No acute large vascular territory infarcts. No abnormal extra-axial fluid collections. Basal cisterns are patent. VASCULAR: Mild calcific atherosclerosis of the carotid siphons. SKULL: No skull fracture. No significant scalp soft tissue swelling. SINUSES/ORBITS: Trace paranasal sinus mucosal thickening. Mastoid air cells are well aerated.The included ocular globes and orbital contents are non-suspicious. OTHER: None. IMPRESSION: Negative non-contrast CT HEAD for age. Electronically Signed   By: Elon Alas M.D.   On: 04/04/2018 22:36   Dg Chest Portable 1 View  Result Date: 04/04/2018 CLINICAL DATA:  Confusion and anxiety beginning today. EXAM: PORTABLE CHEST 1 VIEW COMPARISON:  10/10/2016 FINDINGS: Borderline cardiomegaly. Aortic atherosclerosis without aneurysm. No pulmonary consolidation, effusion or pneumothorax. IMPRESSION: No active disease.  Aortic atherosclerosis. Electronically Signed   By: Ashley Royalty M.D.   On: 04/04/2018 23:01    Wt Readings from Last 3 Encounters:  04/05/18 85.9 kg  12/25/17 83.3 kg  12/21/17 83.9 kg    EKG:    Physical Exam:  Blood pressure 124/73, pulse 64, temperature 98.1 F (36.7 C), temperature source Oral, resp. rate 19, height 6' (1.829 m), weight 85.9 kg, SpO2 96 %. Body mass index is 25.69 kg/m. General: Well developed, well nourished, in no acute distress. Head: Normocephalic,  atraumatic, sclera non-icteric, no xanthomas, nares are without discharge.  Neck: Negative for carotid bruits. JVD not elevated. Lungs:  Clear bilaterally to auscultation without wheezes, rales, or rhonchi. Breathing is unlabored. Heart: RRR with S1 S2. No murmurs, rubs, or gallops appreciated. Abdomen: Soft, non-tender, non-distended with normoactive bowel sounds. No hepatomegaly. No rebound/guarding. No obvious abdominal masses. Msk:  Strength and tone appear normal for age. Extremities: No clubbing or cyanosis. No edema.  Distal pedal pulses are 2+ and equal bilaterally.Excoriation on limbs bilaterally Neuro: Alert and oriented X 3. No facial asymmetry. No focal deficit. Moves all extremities spontaneously. Psych:  Responds to questions appropriately but with somewhat pressured speach     Assessment and Plan  Patient with history of coronary disease with stent in his LAD and 2018 now admitted after presenting to emergency room with hallucinations of insects on his skin.  Is unclear whether he had chest pain.  Electrocardiogram is pending.  Initial troponin drawn as part of protocol showed his 0.34 troponin.  Patient is hemodynamically stable.  Based on the troponin he was placed on heparin.  Will obtain electrocardiogram to evaluate for any interval change.  Troponin elevation does not appear to be secondary to an acute coronary syndrome and appears to be a demand ischemia event.  No evidence currently of a non-ST elevation myocardial infarction.  Would continue heparin and review an echocardiogram and electrocardiogram when available.  Not a candidate for urgent cardiac catheterization at present.  Would agree with psychiatric evaluation.  This event appears to be secondary to hallucinatory event causing demand ischemia.  Signed, Teodoro Spray MD 04/05/2018, 8:23 AM Pager: [phone]]

## 2018-04-05 NOTE — ED Notes (Signed)
heparin verified with cole rn

## 2018-04-05 NOTE — Progress Notes (Addendum)
[facility] [place] NAME: Christopher Carlson    MR#:  [id number]  DATE OF BIRTH:  1940-02-04  SUBJECTIVE:  CHIEF COMPLAINT:   Chief Complaint  Patient presents with  . Insect Bite   -Very talkative.  Mentions about his urological history and the side effects with his antibiotics which he thinks might have started his hallucinations. -No active chest pain at this time  REVIEW OF SYSTEMS:  Review of Systems  Constitutional: Negative for chills and fever.  HENT: Negative for congestion, ear discharge, hearing loss and nosebleeds.   Eyes: Negative for blurred vision and double vision.  Respiratory: Negative for cough, shortness of breath and wheezing.   Cardiovascular: Positive for chest pain. Negative for palpitations and leg swelling.  Gastrointestinal: Negative for abdominal pain, constipation, diarrhea, nausea and vomiting.  Genitourinary: Negative for dysuria.  Neurological: Negative for dizziness, focal weakness, seizures, weakness and headaches.  Psychiatric/Behavioral: Positive for hallucinations.    DRUG ALLERGIES:   Allergies  Allergen Reactions  . Ciprofloxacin Rash    VITALS:  Blood pressure 124/73, pulse 64, temperature 98.1 F (36.7 C), temperature source Oral, resp. rate 19, height 6' (1.829 m), weight 85.9 kg, SpO2 96 %.  PHYSICAL EXAMINATION:  Physical Exam   GENERAL:  78 y.o.-year-old patient lying in the bed with no acute distress.  EYES: Pupils equal, round, reactive to light and accommodation. No scleral icterus. Extraocular muscles intact.  HEENT: Head atraumatic, normocephalic. Oropharynx and nasopharynx clear.  NECK:  Supple, no jugular venous distention. No thyroid enlargement, no tenderness.  LUNGS: Normal breath sounds bilaterally, no wheezing, rales,rhonchi or crepitation. No use of accessory muscles of respiration.  Decreased bibasilar breath sound CARDIOVASCULAR: S1, S2 normal. No  rubs, or gallops.   2/6 systolic murmur is present ABDOMEN: Soft, nontender, nondistended. Bowel sounds present. No organomegaly or mass.  EXTREMITIES: No  cyanosis, or clubbing. 1+ left > right lower extremity edema NEUROLOGIC: Cranial nerves II through XII are intact. Muscle strength 5/5 in all extremities. Sensation intact. Gait not checked.  PSYCHIATRIC: The patient is alert and oriented x 3.  SKIN: scratch marks on the legs and left hand.  Nonblanching petechial rash noted on both lower extremities. No obvious  lesion, or ulcer.    LABORATORY PANEL:   CBC Recent Labs  Lab 04/04/18 2255  WBC 7.2  HGB 13.0  HCT 41.3  PLT 237   ------------------------------------------------------------------------------------------------------------------  Chemistries  Recent Labs  Lab 04/04/18 2255  NA 140  K 3.9  CL 107  CO2 24  GLUCOSE 112*  BUN 22  CREATININE 1.68*  CALCIUM 9.1  AST 41  ALT 20  ALKPHOS 42  BILITOT 0.4   ------------------------------------------------------------------------------------------------------------------  Cardiac Enzymes Recent Labs  Lab 04/04/18 2255  TROPONINI 0.34*   ------------------------------------------------------------------------------------------------------------------  RADIOLOGY:  Ct Head Wo Contrast  Result Date: 04/04/2018 CLINICAL DATA:  Altered level of consciousness. EXAM: CT HEAD WITHOUT CONTRAST TECHNIQUE: Contiguous axial images were obtained from the base of the skull through the vertex without intravenous contrast. COMPARISON:  None. FINDINGS: BRAIN: No intraparenchymal hemorrhage, mass effect nor midline shift. No parenchymal brain volume loss for age. No hydrocephalus. Minimal supratentorial white matter hypodensities less than expected for patient's age, though non-specific are most compatible with chronic small vessel ischemic disease. No acute large vascular territory infarcts. No abnormal extra-axial fluid collections. Basal cisterns  are patent. VASCULAR: Mild calcific atherosclerosis of the carotid siphons. SKULL: No skull fracture. No significant scalp soft tissue  swelling. SINUSES/ORBITS: Trace paranasal sinus mucosal thickening. Mastoid air cells are well aerated.The included ocular globes and orbital contents are non-suspicious. OTHER: None. IMPRESSION: Negative non-contrast CT HEAD for age. Electronically Signed   By: Elon Alas M.D.   On: 04/04/2018 22:36   Dg Chest Portable 1 View  Result Date: 04/04/2018 CLINICAL DATA:  Confusion and anxiety beginning today. EXAM: PORTABLE CHEST 1 VIEW COMPARISON:  10/10/2016 FINDINGS: Borderline cardiomegaly. Aortic atherosclerosis without aneurysm. No pulmonary consolidation, effusion or pneumothorax. IMPRESSION: No active disease.  Aortic atherosclerosis. Electronically Signed   By: Ashley Royalty M.D.   On: 04/04/2018 23:01    EKG:   Orders placed or performed during the hospital encounter of 04/04/18  . ED EKG  . ED EKG  . EKG 12-Lead  . EKG 12-Lead    ASSESSMENT AND PLAN:   L with past medical history significant for CAD status post LAD PCI, ischemic cardiomyopathy with EF of 35 to 45%, history of DVT and PE were never urological procedures was brought in secondary to hallucinations and chest pain.  1.  Elevated troponin and chest pain-concern for possible demand ischemia rather than NSTEMI -No significant EKG changes.  Cardiology has been consulted -Echocardiogram.  Currently on IV heparin drip. -Recycle troponins. no immediate plans to do cardiac catheterization. -Continue cardiac medications.  2.  Visual hallucinations-felt like bugs were crawling over him and he started scratching. -No underlying psychiatric disorder.   -Concern for medication reaction.  No family members available to confirm -Discontinue Bactrim -Urine tox screen and alcohol level is negative -Psychiatry has been consulted by the admitting physician  3.  Lower extremity rash-discontinue  Bactrim as urine analysis does not show any infection  4.  BPH-continue Proscar and Flomax.  Currently does not have Foley catheter.  5.  Hypothyroidism-Synthroid  6.  DVT prophylaxis-on heparin drip   All the records are reviewed and case discussed with Care Management/Social Workerr. Management plans discussed with the patient, family and they are in agreement.  CODE STATUS: Full Code  TOTAL TIME TAKING CARE OF THIS PATIENT: 38 minutes.   POSSIBLE D/C IN 2-3 DAYS, DEPENDING ON CLINICAL CONDITION.   Gladstone Lighter M.D on 04/05/2018 at 11:32 AM  Between 7am to 6pm - Pager - [phone]  After 6pm go to [email or web address] - password [email or web address] [facility]  Office  [phone]  CC: Primary care physician; Maryland Pink, MD

## 2018-04-06 ENCOUNTER — Inpatient Hospital Stay
Admit: 2018-04-06 | Discharge: 2018-04-06 | Disposition: A | Discharge status: Home / Self Care | Payer: Medicare HMO | Attending: Internal Medicine | Admitting: Internal Medicine

## 2018-04-06 DIAGNOSIS — F22 Delusional disorders: Secondary | ICD-10-CM

## 2018-04-06 LAB — TROPONIN I: TROPONIN I: 0.15 ng/mL — AB (ref <0.03)

## 2018-04-06 LAB — HEPARIN LEVEL (UNFRACTIONATED): Heparin Unfractionated: 0.4 IU/mL (ref 0.30–0.70)

## 2018-04-06 MED ORDER — FINASTERIDE 5 MG PO TABS
5.0000 mg | ORAL_TABLET | Freq: Every day | ORAL | Status: DC
  Administered 2018-04-07: 5 mg via ORAL
  Filled 2018-04-06: qty 1

## 2018-04-06 NOTE — Progress Notes (Signed)
*  PRELIMINARY RESULTS* Echocardiogram 2D Echocardiogram has been performed.  Christopher Carlson 04/06/2018, 12:17 PM

## 2018-04-06 NOTE — Plan of Care (Signed)
  Problem: Safety: Goal: Ability to remain free from injury will improve Outcome: Progressing   

## 2018-04-06 NOTE — Progress Notes (Signed)
[facility] [place] NAME: Christopher Carlson    MR#:  [id number]  DATE OF BIRTH:  1940/09/06  SUBJECTIVE:  CHIEF COMPLAINT:   Chief Complaint  Patient presents with  . Insect Bite   -Very talkative.  No chest pain.  Troponins are plateaued. -Insisting of drinking up to 60 ounces of water every day  REVIEW OF SYSTEMS:  Review of Systems  Constitutional: Negative for chills and fever.  HENT: Negative for congestion, ear discharge, hearing loss and nosebleeds.   Eyes: Negative for blurred vision and double vision.  Respiratory: Negative for cough, shortness of breath and wheezing.   Cardiovascular: Positive for chest pain. Negative for palpitations and leg swelling.  Gastrointestinal: Negative for abdominal pain, constipation, diarrhea, nausea and vomiting.  Genitourinary: Negative for dysuria.  Neurological: Negative for dizziness, focal weakness, seizures, weakness and headaches.  Psychiatric/Behavioral: Positive for hallucinations.    DRUG ALLERGIES:   Allergies  Allergen Reactions  . Ciprofloxacin Rash    VITALS:  Blood pressure 123/80, pulse 67, temperature 97.7 F (36.5 C), temperature source Oral, resp. rate 16, height 6' (1.829 m), weight 85.9 kg, SpO2 95 %.  PHYSICAL EXAMINATION:  Physical Exam   GENERAL:  78 y.o.-year-old patient lying in the bed with no acute distress.  EYES: Pupils equal, round, reactive to light and accommodation. No scleral icterus. Extraocular muscles intact.  HEENT: Head atraumatic, normocephalic. Oropharynx and nasopharynx clear.  NECK:  Supple, no jugular venous distention. No thyroid enlargement, no tenderness.  LUNGS: Normal breath sounds bilaterally, no wheezing, rales,rhonchi or crepitation. No use of accessory muscles of respiration.  Decreased bibasilar breath sound CARDIOVASCULAR: S1, S2 normal. No  rubs, or gallops.  2/6 systolic murmur is present ABDOMEN: Soft, nontender, nondistended.  Bowel sounds present. No organomegaly or mass.  EXTREMITIES: No  cyanosis, or clubbing. 1+ left > right lower extremity edema NEUROLOGIC: Cranial nerves II through XII are intact. Muscle strength 5/5 in all extremities. Sensation intact. Gait not checked.  PSYCHIATRIC: The patient is alert and oriented x 3.  Paranoid at times, very talkative with pressured speech SKIN: scratch marks on the legs and left hand.  Nonblanching petechial rash noted on both lower extremities. No obvious  lesion, or ulcer.    LABORATORY PANEL:   CBC Recent Labs  Lab 04/04/18 2255  WBC 7.2  HGB 13.0  HCT 41.3  PLT 237   ------------------------------------------------------------------------------------------------------------------  Chemistries  Recent Labs  Lab 04/04/18 2255  NA 140  K 3.9  CL 107  CO2 24  GLUCOSE 112*  BUN 22  CREATININE 1.68*  CALCIUM 9.1  AST 41  ALT 20  ALKPHOS 42  BILITOT 0.4   ------------------------------------------------------------------------------------------------------------------  Cardiac Enzymes Recent Labs  Lab 04/06/18 0631  TROPONINI 0.15*   ------------------------------------------------------------------------------------------------------------------  RADIOLOGY:  Ct Head Wo Contrast  Result Date: 04/04/2018 CLINICAL DATA:  Altered level of consciousness. EXAM: CT HEAD WITHOUT CONTRAST TECHNIQUE: Contiguous axial images were obtained from the base of the skull through the vertex without intravenous contrast. COMPARISON:  None. FINDINGS: BRAIN: No intraparenchymal hemorrhage, mass effect nor midline shift. No parenchymal brain volume loss for age. No hydrocephalus. Minimal supratentorial white matter hypodensities less than expected for patient's age, though non-specific are most compatible with chronic small vessel ischemic disease. No acute large vascular territory infarcts. No abnormal extra-axial fluid collections. Basal cisterns are patent.  VASCULAR: Mild calcific atherosclerosis of the carotid siphons. SKULL: No skull fracture. No significant scalp soft tissue swelling.  SINUSES/ORBITS: Trace paranasal sinus mucosal thickening. Mastoid air cells are well aerated.The included ocular globes and orbital contents are non-suspicious. OTHER: None. IMPRESSION: Negative non-contrast CT HEAD for age. Electronically Signed   By: Elon Alas M.D.   On: 04/04/2018 22:36   Dg Chest Portable 1 View  Result Date: 04/04/2018 CLINICAL DATA:  Confusion and anxiety beginning today. EXAM: PORTABLE CHEST 1 VIEW COMPARISON:  10/10/2016 FINDINGS: Borderline cardiomegaly. Aortic atherosclerosis without aneurysm. No pulmonary consolidation, effusion or pneumothorax. IMPRESSION: No active disease.  Aortic atherosclerosis. Electronically Signed   By: Ashley Royalty M.D.   On: 04/04/2018 23:01    EKG:   Orders placed or performed during the hospital encounter of 04/04/18  . ED EKG  . ED EKG  . EKG 12-Lead  . EKG 12-Lead    ASSESSMENT AND PLAN:   L with past medical history significant for CAD status post LAD PCI, ischemic cardiomyopathy with EF of 35 to 45%, history of DVT and PE were never urological procedures was brought in secondary to hallucinations and chest pain.  1.  Elevated troponin and chest pain-concern for possible demand ischemia rather than NSTEMI -No significant EKG changes.  Cardiology has been consulted -Echocardiogram is pending.  Currently on IV heparin drip. -discontinue heparin drip if no wall motion abnormalities noted on echo -Stable troponins. no immediate plans to do cardiac catheterization. -Continue cardiac medications.  2.  Visual hallucinations-felt like bugs were crawling over him and he started scratching. -No underlying psychiatric disorder.   -Concern for medication reaction.  -Discontinued Bactrim -Urine tox screen and alcohol level is negative -No hallucinations today, but still thoughts of paranoia and  pressured speech noted. -Psychiatry has been consulted by the admitting physician  3.  Lower extremity rash-discontinue Bactrim as urine analysis does not show any infection  4.  BPH-continue Proscar and Flomax.  Currently does not have Foley catheter.  5.  Hypothyroidism-Synthroid  6.  DVT prophylaxis-on heparin drip-changed to Lovenox once heparin drip is discontinued  Patient is ambulatory at baseline   All the records are reviewed and case discussed with Care Management/Social Workerr. Management plans discussed with the patient, family and they are in agreement.  CODE STATUS: Full Code  TOTAL TIME TAKING CARE OF THIS PATIENT: 38 minutes.   POSSIBLE D/C IN 1-2 DAYS, DEPENDING ON CLINICAL CONDITION.   Gladstone Lighter M.D on 04/06/2018 at 10:18 AM  Between 7am to 6pm - Pager - [phone]  After 6pm go to [email or web address] - password [email or web address] [facility]  Office  [phone]  CC: Primary care physician; [place] Pink, MD

## 2018-04-06 NOTE — Progress Notes (Signed)
[facility] for Heparin  Indication: chest pain/ACS  Allergies  Allergen Reactions  . Ciprofloxacin Rash    Patient Measurements: Height: 6' (182.9 cm) Weight: 189 lb 6.4 oz (85.9 kg) IBW/kg (Calculated) : 77.6 Heparin Dosing Weight: 83.3 kg   Vital Signs: Temp: 97.4 F (36.3 C) (03/08 0353) Temp Source: Oral (03/08 0353) BP: 121/70 (03/08 0353) Pulse Rate: 73 (03/08 0353)  Labs: Recent Labs    04/04/18 2255 04/04/18 2353 04/05/18 0821 04/05/18 1612 04/06/18 0631  HGB 13.0  --   --   --   --   HCT 41.3  --   --   --   --   PLT 237  --   --   --   --   APTT  --  30  --   --   --   LABPROT  --  14.1  --   --   --   INR  --  1.1  --   --   --   HEPARINUNFRC  --   --  0.32 0.34 0.40  CREATININE 1.68*  --   --   --   --   TROPONINI 0.34*  --   --   --   --     Estimated Creatinine Clearance: 40.4 mL/min (A) (by C-G formula based on SCr of 1.68 mg/dL (H)).   Medical History: Past Medical History:  Diagnosis Date  . BPH (benign prostatic hyperplasia)   . CAD (coronary artery disease)   . GERD (gastroesophageal reflux disease)   . High cholesterol   . Hypothyroidism   . Myocardial infarction ([facility]) 09/2016  . Nephrolithiasis   . Thyroid disease     Medications:  Medications Prior to Admission  Medication Sig Dispense Refill Last Dose  . aspirin EC 81 MG tablet Take 81 mg by mouth daily.   04/04/2018 at Unknown time  . atorvastatin (LIPITOR) 40 MG tablet Take 40 mg by mouth at bedtime.    04/03/2018 at Unknown time  . cetirizine (ZYRTEC) 10 MG tablet Take 10 mg by mouth daily.   04/04/2018 at Unknown time  . clonazePAM (KLONOPIN) 0.5 MG tablet Take 0.5 mg by mouth at bedtime.    04/03/2018 at Unknown time  . finasteride (PROSCAR) 5 MG tablet Take 5 mg by mouth at bedtime.    04/03/2018 at Unknown time  . levothyroxine (SYNTHROID, LEVOTHROID) 112 MCG tablet Take 112 mcg by mouth daily before breakfast.    04/04/2018 at Unknown time  .  nitroGLYCERIN (NITROSTAT) 0.4 MG SL tablet Place 1 tablet (0.4 mg total) under the tongue every 5 (five) minutes as needed for chest pain. 30 tablet 0 prn at prn  . pantoprazole (PROTONIX) 40 MG tablet Take 40 mg by mouth daily.   04/04/2018 at Unknown time  . QUEtiapine (SEROQUEL) 50 MG tablet Take 50 mg by mouth at bedtime.   04/03/2018 at Unknown time  . sulfamethoxazole-trimethoprim (BACTRIM DS,SEPTRA DS) 800-160 MG tablet Take 1 tablet by mouth 2 (two) times daily.   04/04/2018 at Unknown time  . tamsulosin (FLOMAX) 0.4 MG CAPS capsule Take 1 capsule (0.4 mg total) by mouth daily. 90 capsule 3 04/04/2018 at Unknown time  . triamcinolone cream (KENALOG) 0.1 % Apply 1 application topically 2 (two) times daily.   04/04/2018 at Unknown time    Assessment: Pharmacy consulted to dose heparin in this 78 year old male admitted with ACS/NSTEMI.   No prior anticoag noted.  CrCl = 40.4 ml/min   3/7 0821 HL 032  3/7 1612 HL 0.34 3/8 0631 HL 0.40  Goal of Therapy:  Heparin level 0.3-0.7 units/ml Monitor platelets by anticoagulation protocol: Yes   Plan:  Continue current rate and will recheck heparin level with AM labs. Daily CBC while on Heparin drip.  Paulina Fusi, PharmD, BCPS 04/06/2018 6:51 AM

## 2018-04-06 NOTE — Progress Notes (Signed)
Patient Name: Christopher Carlson Date of Encounter: 04/06/2018  Hospital Problem List     Active Problems:   Elevated troponin    Patient Profile     Patient with history of coronary disease admitted after a psychotic break with hallucinations.  Had a mild troponin elevation.  No ischemic EKG changes.  Currently stable.  No further chest pain.  Subjective   Less confused.  Will somewhat pressured speech.  Inpatient Medications    . aspirin EC  81 mg Oral Daily  . atorvastatin  40 mg Oral QHS  . clonazePAM  0.5 mg Oral QHS  . docusate sodium  100 mg Oral BID  . finasteride  5 mg Oral QHS  . levothyroxine  112 mcg Oral QAC breakfast  . loratadine  10 mg Oral Daily  . pantoprazole  40 mg Oral Daily  . QUEtiapine  50 mg Oral QHS  . tamsulosin  0.4 mg Oral Daily  . triamcinolone cream  1 application Topical BID    Vital Signs    Vitals:   04/05/18 2003 04/06/18 0353 04/06/18 0827 04/06/18 1528  BP: 126/62 121/70 123/80 128/68  Pulse: 62 73 67 74  Resp: 20 16    Temp: 97.7 F (36.5 C) (!) 97.4 F (36.3 C) 97.7 F (36.5 C) (!) 97.5 F (36.4 C)  TempSrc: Oral Oral Oral Oral  SpO2: 96% 94% 95% 96%  Weight:      Height:        Intake/Output Summary (Last 24 hours) at 04/06/2018 1531 Last data filed at 04/06/2018 1300 Gross per 24 hour  Intake 1527.93 ml  Output 3050 ml  Net -1522.07 ml   Filed Weights   04/04/18 2112 04/05/18 0449 04/05/18 0450  Weight: 83.3 kg 85.9 kg 85.9 kg    Physical Exam    GEN: Well nourished, well developed, in no acute distress.  HEENT: normal.  Neck: Supple, no JVD, carotid bruits, or masses. Cardiac: RRR, no murmurs, rubs, or gallops. No clubbing, cyanosis, edema.  Radials/DP/PT 2+ and equal bilaterally.  Respiratory:  Respirations regular and unlabored, clear to auscultation bilaterally. GI: Soft, nontender, nondistended, BS + x 4. MS: no deformity or atrophy. Skin: warm and dry, no rash. Neuro:  Strength and sensation are  intact. Psych: Normal affect.  Labs    CBC Recent Labs    04/04/18 2255  WBC 7.2  NEUTROABS 5.2  HGB 13.0  HCT 41.3  MCV 91.6  PLT 557   Basic Metabolic Panel Recent Labs    04/04/18 2255  NA 140  K 3.9  CL 107  CO2 24  GLUCOSE 112*  BUN 22  CREATININE 1.68*  CALCIUM 9.1   Liver Function Tests Recent Labs    04/04/18 2255  AST 41  ALT 20  ALKPHOS 42  BILITOT 0.4  PROT 7.0  ALBUMIN 3.8   No results for input(s): LIPASE, AMYLASE in the last 72 hours. Cardiac Enzymes Recent Labs    04/04/18 2255 04/06/18 0631  TROPONINI 0.34* 0.15*   BNP No results for input(s): BNP in the last 72 hours. D-Dimer No results for input(s): DDIMER in the last 72 hours. Hemoglobin A1C No results for input(s): HGBA1C in the last 72 hours. Fasting Lipid Panel No results for input(s): CHOL, HDL, LDLCALC, TRIG, CHOLHDL, LDLDIRECT in the last 72 hours. Thyroid Function Tests Recent Labs    04/05/18 0821  TSH 5.123*    Telemetry    Sinus rhythm with no ischemia  ECG  Sinus rhythm  Radiology    Ct Head Wo Contrast  Result Date: 04/04/2018 CLINICAL DATA:  Altered level of consciousness. EXAM: CT HEAD WITHOUT CONTRAST TECHNIQUE: Contiguous axial images were obtained from the base of the skull through the vertex without intravenous contrast. COMPARISON:  None. FINDINGS: BRAIN: No intraparenchymal hemorrhage, mass effect nor midline shift. No parenchymal brain volume loss for age. No hydrocephalus. Minimal supratentorial white matter hypodensities less than expected for patient's age, though non-specific are most compatible with chronic small vessel ischemic disease. No acute large vascular territory infarcts. No abnormal extra-axial fluid collections. Basal cisterns are patent. VASCULAR: Mild calcific atherosclerosis of the carotid siphons. SKULL: No skull fracture. No significant scalp soft tissue swelling. SINUSES/ORBITS: Trace paranasal sinus mucosal thickening. Mastoid  air cells are well aerated.The included ocular globes and orbital contents are non-suspicious. OTHER: None. IMPRESSION: Negative non-contrast CT HEAD for age. Electronically Signed   By: Elon Alas M.D.   On: 04/04/2018 22:36   Dg Chest Portable 1 View  Result Date: 04/04/2018 CLINICAL DATA:  Confusion and anxiety beginning today. EXAM: PORTABLE CHEST 1 VIEW COMPARISON:  10/10/2016 FINDINGS: Borderline cardiomegaly. Aortic atherosclerosis without aneurysm. No pulmonary consolidation, effusion or pneumothorax. IMPRESSION: No active disease.  Aortic atherosclerosis. Electronically Signed   By: Ashley Royalty M.D.   On: 04/04/2018 23:01    Assessment & Plan    Elevated troponin-appears to be demand ischemia secondary to psychotic episode with hallucinations.  Did not appear to have an acute coronary event.  Will discontinue heparin continue with current medical therapy.  Not a candidate for cardiac catheterization at present.  Signed, Javier Docker Asbury Hair MD 04/06/2018, 3:31 PM  Pager: [phone]

## 2018-04-06 NOTE — Consult Note (Signed)
[facility] Consult   Reason for Consult: Visual hallucinations/delusional parasitosis Referring Physician: Dr. Tressia Miners Patient Identification: Christopher Carlson MRN:  [id number] Principal Diagnosis: Visual hallucinations, delusional parasitosis, rule out Lewy body dementia Diagnosis:  Active Problems:   Elevated troponin   Total Time spent with patient: 45 minutes  Subjective:   Christopher Carlson is a 78 y.o. male patient who initially presented to the emergency department with complaints of insect bites, there was concern from his treating team and family that the patient has been hallucinating. During his medical work-up he was found to have elevated troponin and was admitted for medical evaluation and treatment of potential NSTEMI   HPI: The patient was seen and evaluated this afternoon he was found resting in his room.  He was notably garrulous and significantly circumferential when discussing his current situation and what brought him to the hospital.  He spent the initial 10 minutes of the interview reviewing his extensive urological history over the past 12 months.  When interrupted or redirected he would simply return to his previous space in the story and was unwilling to answer direct questions until he could "explain the entire story".  Eventually he began to explain that he is living with his daughter and over the past 2 weeks they have been treating the house for termites.  He reports that at one point they termites had infested his room in the pool house, and that they had been biting him during his sleep.  He reported his extensive attempts to capture said insects, in order to prove to his daughter that they were exist.  He noted at times that the insects would retreat inside his body.  He eventually moved into a motel room where he continued to be bothered by said bugs, which led to him coming to the emergency department.  He derailed to the interview several times in  order to count and display the excoriations found on his wrists ankles and buttocks.  He denies a past history of similar episodes, he reports having difficulty sleeping for decades, and reports that he has been taking Seroquel and Klonopin for significant amount of time to help with his insomnia.   He denied symptoms consistent with a major depressive disorder, denied symptoms consistent with a bipolar disorder, denied symptoms consistent with schizophrenia, denies symptoms consistent with a underlying anxiety disorder (and reports that is just who I am)  His daughter, Christopher Carlson, was contacted to provide collateral information, her phone number was [phone].  She reports that the patient has had a similar episode roughly 2 years ago following an infection where he was hospitalized at [facility].  She does not remember his diagnosis at the time, and stated that while he followed up with an outpatient psychiatrist they did not change his medications.  Unfortunately she was unable to provide information regarding his sleep habits, such as dream reenactment or REM sleep behavior.  He performed well on his cognitive screening, his RASS score was +1 he was able to name 20 four-legged animals within 1 minute, he passed his vigilant A's, he was able to count backwards from 100 by sevens, his immediate recall is intact, his delayed recall was 0 out of 3, with 3 out of 3 with prompts.   Past Psychiatric History: [name] 2 years ago, unclear psychiatric diagnosis, unclear outpatient psychiatry follow-up.  Past Medical History:  Past Medical History:  Diagnosis Date  . BPH (benign prostatic hyperplasia)   . CAD (coronary  artery disease)   . GERD (gastroesophageal reflux disease)   . High cholesterol   . Hypothyroidism   . Myocardial infarction ([place]) 09/2016  . Nephrolithiasis   . Thyroid disease     Past Surgical History:  Procedure Laterality Date  . CORONARY STENT INTERVENTION N/A 10/12/2016    Procedure: CORONARY STENT INTERVENTION;  Surgeon: Yolonda Kida, MD;  Location: [facility];  Service: Cardiovascular;  Laterality: N/A;  . CYSTOSCOPY W/ RETROGRADES Bilateral 12/16/2017   Procedure: CYSTOSCOPY WITH RETROGRADE PYELOGRAM;  Surgeon: Hollice Espy, MD;  Location: [facility];  Service: Urology;  Laterality: Bilateral;  . CYSTOSCOPY WITH FULGERATION  12/16/2017   Procedure: CYSTOSCOPY WITH FULGERATION of bleeding prostate;  Surgeon: Hollice Espy, MD;  Location: [facility];  Service: Urology;;  . [name] WITH URETHRAL DILATATION N/A 12/16/2017   Procedure: CYSTOSCOPY WITH URETHRAL DILATATION;  Surgeon: Hollice Espy, MD;  Location: [facility];  Service: Urology;  Laterality: N/A;  . HERNIA REPAIR    . LEFT HEART CATH AND CORONARY ANGIOGRAPHY N/A 10/12/2016   Procedure: LEFT HEART CATH AND CORONARY ANGIOGRAPHY and possible pci;  Surgeon: Yolonda Kida, MD;  Location: [facility];  Service: Cardiovascular;  Laterality: N/A;   Family History:  Family History  Problem Relation Age of Onset  . Cancer Father   . Alcohol abuse Father   . Prostate cancer Neg Hx   . Bladder Cancer Neg Hx   . Kidney cancer Neg Hx    Family Psychiatric  History: Denies Social History:  Social History   Substance and Sexual Activity  Alcohol Use No     Social History   Substance and Sexual Activity  Drug Use No    Social History   Socioeconomic History  . Marital status: Married    Spouse name: Not on file  . Number of children: Not on file  . Years of education: Not on file  . Highest education level: Not on file  Occupational History  . Not on file  Social Needs  . Financial resource strain: Not on file  . Food insecurity:    Worry: Not on file    Inability: Not on file  . Transportation needs:    Medical: Not on file    Non-medical: Not on file  Tobacco Use  . Smoking status: Former Smoker    Packs/day: 2.50    Years: 20.00    Pack years: 50.00     Types: Cigarettes  . Smokeless tobacco: Never Used  Substance and Sexual Activity  . Alcohol use: No  . Drug use: No  . Sexual activity: Not on file  Lifestyle  . Physical activity:    Days per week: Not on file    Minutes per session: Not on file  . Stress: Not on file  Relationships  . Social connections:    Talks on phone: Not on file    Gets together: Not on file    Attends religious service: Not on file    Active member of club or organization: Not on file    Attends meetings of clubs or organizations: Not on file    Relationship status: Not on file  Other Topics Concern  . Not on file  Social History Narrative  . Not on file   Additional Social History:    Allergies:   Allergies  Allergen Reactions  . Ciprofloxacin Rash    Labs:  Results for orders placed or performed during the hospital encounter of 04/04/18 (  from the past 48 hour(s))  Comprehensive metabolic panel     Status: Abnormal   Collection Time: 04/04/18 10:55 PM  Result Value Ref Range   Sodium 140 135 - 145 mmol/L   Potassium 3.9 3.5 - 5.1 mmol/L   Chloride 107 98 - 111 mmol/L   CO2 24 22 - 32 mmol/L   Glucose, Bld 112 (H) 70 - 99 mg/dL   BUN 22 8 - 23 mg/dL   Creatinine, Ser 1.68 (H) 0.61 - 1.24 mg/dL   Calcium 9.1 8.9 - 10.3 mg/dL   Total Protein 7.0 6.5 - 8.1 g/dL   Albumin 3.8 3.5 - 5.0 g/dL   AST 41 15 - 41 U/L   ALT 20 0 - 44 U/L   Alkaline Phosphatase 42 38 - 126 U/L   Total Bilirubin 0.4 0.3 - 1.2 mg/dL   GFR calc non Af Amer 39 (L) >60 mL/min   GFR calc Af Amer 45 (L) >60 mL/min   Anion gap 9 5 - 15    Comment: Performed at [facility], [address] [place], [place] [address]  Acetaminophen level     Status: Abnormal   Collection Time: 04/04/18 10:55 PM  Result Value Ref Range   Acetaminophen (Tylenol), Serum <10 (L) 10 - 30 ug/mL    Comment: (NOTE) Therapeutic concentrations vary significantly. A range of 10-30 ug/mL  may be an effective concentration for many  patients. However, some  are best treated at concentrations outside of this range. Acetaminophen concentrations >150 ug/mL at 4 hours after ingestion  and >50 ug/mL at 12 hours after ingestion are often associated with  toxic reactions. Performed at [facility], [address] [place], [place] [address]   Ethanol     Status: None   Collection Time: 04/04/18 10:55 PM  Result Value Ref Range   Alcohol, Ethyl (B) <10 <10 mg/dL    Comment: (NOTE) Lowest detectable limit for serum alcohol is 10 mg/dL. For medical purposes only. Performed at [facility], [address] [place], [place] [address]   Salicylate level     Status: None   Collection Time: 04/04/18 10:55 PM  Result Value Ref Range   Salicylate Lvl <0.2 2.8 - 30.0 mg/dL    Comment: Performed at [facility], [address] [place], [place] [address]  Troponin I - Once     Status: Abnormal   Collection Time: 04/04/18 10:55 PM  Result Value Ref Range   Troponin I 0.34 (HH) <0.03 ng/mL    Comment: CRITICAL RESULT CALLED TO, READ BACK BY AND VERIFIED WITH GRACIE [facility] AT 2330 04/04/2018.  TFK Performed at [facility], [address] [place], [place] [address]   CBC with Differential     Status: None   Collection Time: 04/04/18 10:55 PM  Result Value Ref Range   WBC 7.2 4.0 - 10.5 K/uL   RBC 4.51 4.22 - 5.81 MIL/uL   Hemoglobin 13.0 13.0 - 17.0 g/dL   HCT 41.3 39.0 - 52.0 %   MCV 91.6 80.0 - 100.0 fL   MCH 28.8 26.0 - 34.0 pg   MCHC 31.5 30.0 - 36.0 g/dL   RDW 13.4 11.5 - 15.5 %   Platelets 237 150 - 400 K/uL   nRBC 0.0 0.0 - 0.2 %   Neutrophils Relative % 73 %   Neutro Abs 5.2 1.7 - 7.7 K/uL   Lymphocytes Relative 14 %   Lymphs Abs 1.0 0.7 - 4.0 K/uL  Monocytes Relative 10 %   Monocytes Absolute 0.7 0.1 - 1.0 K/uL   Eosinophils Relative 2 %   Eosinophils Absolute 0.2 0.0 - 0.5 K/uL   Basophils Relative 1 %   Basophils Absolute 0.1 0.0 - 0.1 K/uL   Immature  Granulocytes 0 %   Abs Immature Granulocytes 0.01 0.00 - 0.07 K/uL    Comment: Performed at [facility], [address] [place], [place] [address]  APTT     Status: None   Collection Time: 04/04/18 11:53 PM  Result Value Ref Range   aPTT 30 24 - 36 seconds    Comment: Performed at [facility], [address] [place], [place] [address]  Protime-INR     Status: None   Collection Time: 04/04/18 11:53 PM  Result Value Ref Range   Prothrombin Time 14.1 11.4 - 15.2 seconds   INR 1.1 0.8 - 1.2    Comment: (NOTE) INR goal varies based on device and disease states. Performed at [facility], [address] [place], [place] [address]   Urinalysis, Complete w Microscopic     Status: Abnormal   Collection Time: 04/05/18 12:00 AM  Result Value Ref Range   Color, Urine YELLOW (A) YELLOW   APPearance CLEAR (A) CLEAR   Specific Gravity, Urine 1.016 1.005 - 1.030   pH 6.0 5.0 - 8.0   Glucose, UA NEGATIVE NEGATIVE mg/dL   Hgb urine dipstick SMALL (A) NEGATIVE   Bilirubin Urine NEGATIVE NEGATIVE   Ketones, ur NEGATIVE NEGATIVE mg/dL   Protein, ur NEGATIVE NEGATIVE mg/dL   Nitrite NEGATIVE NEGATIVE   Leukocytes,Ua TRACE (A) NEGATIVE   RBC / HPF 0-5 0 - 5 RBC/hpf   WBC, UA 0-5 0 - 5 WBC/hpf   Bacteria, UA NONE SEEN NONE SEEN   Squamous Epithelial / LPF NONE SEEN 0 - 5    Comment: Performed at [facility], [address], [place], [place] [address]  Urine Drug Screen, Qualitative     Status: None   Collection Time: 04/05/18 12:00 AM  Result Value Ref Range   Tricyclic, Ur Screen NONE DETECTED NONE DETECTED   Amphetamines, Ur Screen NONE DETECTED NONE DETECTED   MDMA (Ecstasy)Ur Screen NONE DETECTED NONE DETECTED   Cocaine Metabolite,Ur [place] NONE DETECTED NONE DETECTED   Opiate, Ur Screen NONE DETECTED NONE DETECTED   Phencyclidine (PCP) Ur S NONE DETECTED NONE DETECTED   Cannabinoid 50 Ng, Ur [place] NONE DETECTED NONE DETECTED   Barbiturates, Ur Screen  NONE DETECTED NONE DETECTED   Benzodiazepine, Ur Scrn NONE DETECTED NONE DETECTED   Methadone Scn, Ur NONE DETECTED NONE DETECTED    Comment: (NOTE) Tricyclics + metabolites, urine    Cutoff 1000 ng/mL Amphetamines + metabolites, urine  Cutoff 1000 ng/mL MDMA (Ecstasy), urine              Cutoff 500 ng/mL Cocaine Metabolite, urine          Cutoff 300 ng/mL Opiate + metabolites, urine        Cutoff 300 ng/mL Phencyclidine (PCP), urine         Cutoff 25 ng/mL Cannabinoid, urine                 Cutoff 50 ng/mL Barbiturates + metabolites, urine  Cutoff 200 ng/mL Benzodiazepine, urine              Cutoff 200 ng/mL Methadone, urine  Cutoff 300 ng/mL The urine drug screen provides only a preliminary, unconfirmed analytical test result and should not be used for non-medical purposes. Clinical consideration and professional judgment should be applied to any positive drug screen result due to possible interfering substances. A more specific alternate chemical method must be used in order to obtain a confirmed analytical result. Gas chromatography / mass spectrometry (GC/MS) is the preferred confirmat ory method. Performed at [facility], [place], [place] [address]   Heparin level (unfractionated)     Status: None   Collection Time: 04/05/18  8:21 AM  Result Value Ref Range   Heparin Unfractionated 0.32 0.30 - 0.70 IU/mL    Comment: (NOTE) If heparin results are below expected values, and patient dosage has  been confirmed, suggest follow up testing of antithrombin III levels. Performed at [facility], [address] [place], [place] [address]   TSH     Status: Abnormal   Collection Time: 04/05/18  8:21 AM  Result Value Ref Range   TSH 5.123 (H) 0.350 - 4.500 uIU/mL    Comment: Performed by a 3rd Generation assay with a functional sensitivity of <=0.01 uIU/mL. Performed at [facility], [place],  [place] [address]   Heparin level (unfractionated)     Status: None   Collection Time: 04/05/18  4:12 PM  Result Value Ref Range   Heparin Unfractionated 0.34 0.30 - 0.70 IU/mL    Comment: (NOTE) If heparin results are below expected values, and patient dosage has  been confirmed, suggest follow up testing of antithrombin III levels. Performed at [facility], [place], [place] [address]   Heparin level (unfractionated)     Status: None   Collection Time: 04/06/18  6:31 AM  Result Value Ref Range   Heparin Unfractionated 0.40 0.30 - 0.70 IU/mL    Comment: (NOTE) If heparin results are below expected values, and patient dosage has  been confirmed, suggest follow up testing of antithrombin III levels. Performed at [facility], [address] [place], [place] [address]   Troponin I - Once     Status: Abnormal   Collection Time: 04/06/18  6:31 AM  Result Value Ref Range   Troponin I 0.15 (HH) <0.03 ng/mL    Comment: CRITICAL VALUE NOTED. VALUE IS CONSISTENT WITH PREVIOUSLY REPORTED/CALLED VALUE/HKP Performed at [facility] [address] [place], [place] [address]     Current Facility-Administered Medications  Medication Dose Route Frequency Provider Last Rate Last Dose  . acetaminophen (TYLENOL) tablet 650 mg  650 mg Oral Q6H PRN Harrie Foreman, MD       Or  . acetaminophen (TYLENOL) suppository 650 mg  650 mg Rectal Q6H PRN Harrie Foreman, MD      . aspirin EC tablet 81 mg  81 mg Oral Daily Harrie Foreman, MD   81 mg at 04/06/18 1008  . atorvastatin (LIPITOR) tablet 40 mg  40 mg Oral QHS Harrie Foreman, MD   40 mg at 04/05/18 2106  . clonazePAM (KLONOPIN) tablet 0.5 mg  0.5 mg Oral QHS Harrie Foreman, MD   0.5 mg at 04/05/18 2227  . docusate sodium (COLACE) capsule 100 mg  100 mg Oral BID Harrie Foreman, MD   100 mg at 04/06/18 1008  . finasteride (PROSCAR) tablet 5 mg  5 mg Oral QHS Harrie Foreman, MD      .  levothyroxine (SYNTHROID, LEVOTHROID)  tablet 112 mcg  112 mcg Oral QAC breakfast Harrie Foreman, MD   112 mcg at 04/06/18 1008  . loratadine (CLARITIN) tablet 10 mg  10 mg Oral Daily Harrie Foreman, MD   10 mg at 04/06/18 1008  . nitroGLYCERIN (NITROSTAT) SL tablet 0.4 mg  0.4 mg Sublingual Q5 min PRN Harrie Foreman, MD      . ondansetron [facility]) tablet 4 mg  4 mg Oral Q6H PRN Harrie Foreman, MD       Or  . ondansetron [facility]) injection 4 mg  4 mg Intravenous Q6H PRN Harrie Foreman, MD      . pantoprazole (PROTONIX) EC tablet 40 mg  40 mg Oral Daily Harrie Foreman, MD   40 mg at 04/06/18 1008  . QUEtiapine (SEROQUEL) tablet 50 mg  50 mg Oral QHS Harrie Foreman, MD   50 mg at 04/05/18 2227  . tamsulosin (FLOMAX) capsule 0.4 mg  0.4 mg Oral Daily Harrie Foreman, MD   0.4 mg at 04/06/18 1008  . triamcinolone cream (KENALOG) 0.1 % 1 application  1 application Topical BID Harrie Foreman, MD   1 application at 21/30/86 2108    Musculoskeletal: Strength & Muscle Tone: Capable of standing on his own Gait & Station: normal Patient leans: N/A  Psychiatric Specialty Exam: Physical Exam  Constitutional: He is oriented to person, place, and time. He appears well-developed and well-nourished.  HENT:  Head: Normocephalic and atraumatic.  Respiratory: Effort normal.  Musculoskeletal: Normal range of motion.  Neurological: He is alert and oriented to person, place, and time.    Review of Systems  Genitourinary: Positive for dysuria.  Skin: Positive for itching and rash.  Neurological: Negative for dizziness.  Psychiatric/Behavioral: The patient has insomnia.     Blood pressure 128/68, pulse 74, temperature (!) 97.5 F (36.4 C), temperature source Oral, resp. rate 16, height 6' (1.829 m), weight 85.9 kg, SpO2 96 %.Body mass index is 25.69 kg/m.  General Appearance: Neat  Eye Contact:  Fair  Speech:  Pressured  Volume:  Normal  Mood:  "just fine"  Affect:  ,  Somewhat anxious, pleasant  Thought Process:  Descriptions of Associations: Circumstantial  Orientation:  Full (Time, Place, and Person)  Thought Content:  Delusions and Hallucinations: Visual  Suicidal Thoughts:  No  Homicidal Thoughts:  No  Memory:  Immediate;   Fair Recent;   Fair  Judgement:  Fair  Insight:  Limited  Psychomotor Activity:  Slight psychomotor agitation  Concentration:  Concentration: Fair  Recall:  [employer] of Knowledge:  Fair  Language:  Fair  Akathisia:  No  Handed:  Right  AIMS (if indicated):     Assets:  Social Support Vocational/Educational  ADL's:  Intact  Cognition:  WNL  Sleep:        Treatment Plan Summary: Daily contact with patient to assess and evaluate symptoms and progress in treatment, Medication management and Plan as follows   While the patient appears to have improved since his admission, he has several symptoms concerning for a possible underlying Lewy body dementia.  Typically there would be the appearance of visual hallucinations, affective changes, and at times delusional constructs (among other symptoms) which would precede cognitive impairment. This would be best evaluated during an inpatient geriatric psychiatry admission.  Would attempt to minimize or eliminate his benzodiazepine use, would avoid any potential medications that could exacerbate delirium.  Considering Lewy body dementia is on the differential, would  recommend minimal use of antipsychotics, should antipsychotics be necessary olanzapine or Seroquel tend to be the medications of choice.   Disposition: Recommend psychiatric Inpatient admission when medically cleared.  Chong Sicilian, DO 04/06/2018 4:54 PM

## 2018-04-07 DIAGNOSIS — R410 Disorientation, unspecified: Secondary | ICD-10-CM

## 2018-04-07 DIAGNOSIS — R7989 Other specified abnormal findings of blood chemistry: Secondary | ICD-10-CM

## 2018-04-07 LAB — BASIC METABOLIC PANEL
Anion gap: 3 — ABNORMAL LOW (ref 5–15)
BUN: 19 mg/dL (ref 8–23)
CO2: 25 mmol/L (ref 22–32)
Calcium: 8.3 mg/dL — ABNORMAL LOW (ref 8.9–10.3)
Chloride: 112 mmol/L — ABNORMAL HIGH (ref 98–111)
Creatinine, Ser: 1.18 mg/dL (ref 0.61–1.24)
GFR calc Af Amer: >60 mL/min (ref >60)
GFR calc non Af Amer: 59 mL/min — ABNORMAL LOW (ref >60)
Glucose, Bld: 96 mg/dL (ref 70–99)
POTASSIUM: 3.8 mmol/L (ref 3.5–5.1)
SODIUM: 140 mmol/L (ref 135–145)

## 2018-04-07 LAB — CBC
HCT: 39.3 % (ref 39.0–52.0)
Hemoglobin: 12.3 g/dL — ABNORMAL LOW (ref 13.0–17.0)
MCH: 29.2 pg (ref 26.0–34.0)
MCHC: 31.3 g/dL (ref 30.0–36.0)
MCV: 93.3 fL (ref 80.0–100.0)
NRBC: 0 % (ref 0.0–0.2)
Platelets: 239 10*3/uL (ref 150–400)
RBC: 4.21 MIL/uL — AB (ref 4.22–5.81)
RDW: 13.7 % (ref 11.5–15.5)
WBC: 6.7 10*3/uL (ref 4.0–10.5)

## 2018-04-07 LAB — ECHOCARDIOGRAM COMPLETE
Height: 72 in
Weight: 3030.4 oz
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
## 2018-04-07 NOTE — Discharge Summary (Signed)
[facility] [place] NAME: Christopher Carlson    MR#:  [id number]  DATE OF BIRTH:  Nov 07, 1940  DATE OF ADMISSION:  04/04/2018   ADMITTING PHYSICIAN: Harrie Foreman, MD  DATE OF DISCHARGE: 04/07/18  PRIMARY CARE PHYSICIAN: Maryland Pink, MD   ADMISSION DIAGNOSIS:   Elevated troponin [R79.89] Altered mental status, unspecified altered mental status type [R41.82]  DISCHARGE DIAGNOSIS:   Active Problems:   Elevated troponin   Acute delirium   SECONDARY DIAGNOSIS:   Past Medical History:  Diagnosis Date  . BPH (benign prostatic hyperplasia)   . CAD (coronary artery disease)   . GERD (gastroesophageal reflux disease)   . High cholesterol   . Hypothyroidism   . Myocardial infarction ([place]) 09/2016  . Nephrolithiasis   . Thyroid disease     HOSPITAL COURSE:   78 y/o male with past medical history significant for CAD status post LAD PCI, ischemic cardiomyopathy with EF of 35 to 45%, history of DVT and PE were never urological procedures was brought in secondary to hallucinations and chest pain.  1.  Elevated troponin and chest pain-   demand ischemia and not NSTEMI -No significant EKG changes.  Cardiology consult appreciated -Echocardiogram did not show any acute findings.  off IV heparin drip. -Stable troponins. no for cardiac catheterization. -Continue cardiac medications.   2.  Visual hallucinations- delusional parasitosis - felt like bugs were crawling over him and he started scratching.   -Concern for medication reaction.  -Discontinued Bactrim-appreciate psychiatry consult.  Symptoms have resolved. -Urine tox screen and alcohol level is negative -Continue Seroquel and Klonopin. -Outpatient follow-up with psychiatry recommended.  No suggestions for inpatient psych admission.  Appreciate Dr. Beckey Downing input.  Please read her detailed notes from today  3.  Lower extremity rash-discontinued Bactrim as urine analysis does  not show any infection Improved rash  4.  BPH-continue Proscar and Flomax.  Currently does not have Foley catheter.  5.  Hypothyroidism-Synthroid   Patient is ambulatory at baseline Will be discharged home today  DISCHARGE CONDITIONS:   Guarded  CONSULTS OBTAINED:   Treatment Team:  Clapacs, Madie Reno, MD Chong Sicilian, DO  DRUG ALLERGIES:   Allergies  Allergen Reactions  . Ciprofloxacin Rash   DISCHARGE MEDICATIONS:   Allergies as of 04/07/2018      Reactions   Ciprofloxacin Rash      Medication List    STOP taking these medications   sulfamethoxazole-trimethoprim 800-160 MG tablet Commonly known as:  BACTRIM DS,SEPTRA DS     TAKE these medications   aspirin EC 81 MG tablet Take 81 mg by mouth daily.   atorvastatin 40 MG tablet Commonly known as:  LIPITOR Take 40 mg by mouth at bedtime.   cetirizine 10 MG tablet Commonly known as:  ZYRTEC Take 10 mg by mouth daily.   clonazePAM 0.5 MG tablet Commonly known as:  KLONOPIN Take 0.5 mg by mouth at bedtime.   finasteride 5 MG tablet Commonly known as:  PROSCAR Take 5 mg by mouth at bedtime.   levothyroxine 112 MCG tablet Commonly known as:  SYNTHROID, LEVOTHROID Take 112 mcg by mouth daily before breakfast.   nitroGLYCERIN 0.4 MG SL tablet Commonly known as:  NITROSTAT Place 1 tablet (0.4 mg total) under the tongue every 5 (five) minutes as needed for chest pain.   pantoprazole 40 MG tablet Commonly known as:  PROTONIX Take 40 mg by mouth daily.   QUEtiapine 50 MG tablet  Commonly known as:  SEROQUEL Take 50 mg by mouth at bedtime.   tamsulosin 0.4 MG Caps capsule Commonly known as:  FLOMAX Take 1 capsule (0.4 mg total) by mouth daily.   triamcinolone cream 0.1 % Commonly known as:  KENALOG Apply 1 application topically 2 (two) times daily.        DISCHARGE INSTRUCTIONS:   1.  PCP follow-up in 1 to 2 weeks 2.  Psychiatry follow-up in 2 weeks  DIET:   Cardiac diet  ACTIVITY:    Activity as tolerated  OXYGEN:   Home Oxygen: No.  Oxygen Delivery: room air  DISCHARGE LOCATION:   home   If you experience worsening of your admission symptoms, develop shortness of breath, life threatening emergency, suicidal or homicidal thoughts you must seek medical attention immediately by calling 911 or calling your MD immediately  if symptoms less severe.  You Must read complete instructions/literature along with all the possible adverse reactions/side effects for all the Medicines you take and that have been prescribed to you. Take any new Medicines after you have completely understood and accpet all the possible adverse reactions/side effects.   Please note  You were cared for by a hospitalist during your hospital stay. If you have any questions about your discharge medications or the care you received while you were in the hospital after you are discharged, you can call the unit and asked to speak with the hospitalist on call if the hospitalist that took care of you is not available. Once you are discharged, your primary care physician will handle any further medical issues. Please note that NO REFILLS for any discharge medications will be authorized once you are discharged, as it is imperative that you return to your primary care physician (or establish a relationship with a primary care physician if you do not have one) for your aftercare needs so that they can reassess your need for medications and monitor your lab values.    On the day of Discharge:  VITAL SIGNS:   Blood pressure 110/71, pulse 72, temperature (!) 97.4 F (36.3 C), temperature source Oral, resp. rate 16, height 6' (1.829 m), weight 83.1 kg, SpO2 95 %.  PHYSICAL EXAMINATION:    GENERAL:  78 y.o.-year-old patient lying in the bed with no acute distress.  EYES: Pupils equal, round, reactive to light and accommodation. No scleral icterus. Extraocular muscles intact.  HEENT: Head atraumatic,  normocephalic. Oropharynx and nasopharynx clear.  NECK:  Supple, no jugular venous distention. No thyroid enlargement, no tenderness.  LUNGS: Normal breath sounds bilaterally, no wheezing, rales,rhonchi or crepitation. No use of accessory muscles of respiration.  Decreased bibasilar breath sounds CARDIOVASCULAR: S1, S2 normal. No  rubs, or gallops.  2/6 systolic murmur is present ABDOMEN: Soft, nontender, nondistended. Bowel sounds present. No organomegaly or mass.  EXTREMITIES: No  cyanosis, or clubbing. 1+ left > right lower extremity edema NEUROLOGIC: Cranial nerves II through XII are intact. Muscle strength 5/5 in all extremities. Sensation intact. Gait not checked.  PSYCHIATRIC: The patient is alert and oriented x 3.  very talkative SKIN: scratch marks on the legs and left hand.  Nonblanching petechial rash noted on both lower extremities. No obvious  lesion, or ulcer.  DATA REVIEW:   CBC Recent Labs  Lab 04/07/18 0608  WBC 6.7  HGB 12.3*  HCT 39.3  PLT 239    Chemistries  Recent Labs  Lab 04/04/18 2255 04/07/18 0608  NA 140 140  K 3.9 3.8  CL 107  112*  CO2 24 25  GLUCOSE 112* 96  BUN 22 19  CREATININE 1.68* 1.18  CALCIUM 9.1 8.3*  AST 41  --   ALT 20  --   ALKPHOS 42  --   BILITOT 0.4  --      Microbiology Results  Results for orders placed or performed during the hospital encounter of 12/21/17  Urine culture     Status: None   Collection Time: 12/21/17 10:05 AM  Result Value Ref Range Status   Specimen Description   Final    URINE, RANDOM Performed at [facility], [address], [place], [place] [address]    Special Requests   Final    NONE Performed at [facility], [address] [place], [place] [address]    Culture   Final    NO GROWTH Performed at [facility], [place] [address] [place], [place] [address]    Report Status 12/22/2017 FINAL  Final    RADIOLOGY:  No results found.   Management plans discussed with  the patient, family and they are in agreement.  CODE STATUS:     Code Status Orders  (From admission, onward)         Start     Ordered   04/05/18 0446  Full code  Continuous     04/05/18 0445        Code Status History    Date Active Date Inactive Code Status Order ID Comments User Context   12/21/2017 1554 12/24/2017 1957 Full Code [id number]  Billey Co, MD Inpatient   02/01/2017 2027 02/03/2017 1442 Full Code [id number]  Henreitta Leber, MD Inpatient   10/10/2016 2353 10/13/2016 1828 Full Code [id number]  Hugelmeyer, Ubaldo Glassing, DO Inpatient    Advance Directive Documentation     Most Recent Value  Type of Advance Directive  Healthcare Power of Attorney  Pre-existing out of facility DNR order (yellow form or pink MOST form)  -  "MOST" Form in Place?  -      TOTAL TIME TAKING CARE OF THIS PATIENT: 38 minutes.    Gladstone Lighter M.D on 04/07/2018 at 2:18 PM  Between 7am to 6pm - Pager - [phone]  After 6pm go to [email or web address] - Proofreader  [facility]  Office  [phone]  CC: Primary care physician; Maryland Pink, MD   Note: This dictation was prepared with Dragon dictation along with smaller phrase technology. Any transcriptional errors that result from this process are unintentional.

## 2018-04-07 NOTE — Progress Notes (Signed)
Discharge instructions explained to pt/ verbalized an understanding / iv and tele removed/ transported off unit via wheelchair.  

## 2018-04-18 ENCOUNTER — Ambulatory Visit: Payer: Medicare HMO | Admitting: Urology

## 2018-04-22 DIAGNOSIS — R21 Rash and other nonspecific skin eruption: Secondary | ICD-10-CM | POA: Diagnosis not present

## 2018-04-22 DIAGNOSIS — Z1331 Encounter for screening for depression: Secondary | ICD-10-CM | POA: Diagnosis not present

## 2018-04-22 DIAGNOSIS — Z139 Encounter for screening, unspecified: Secondary | ICD-10-CM | POA: Diagnosis not present

## 2018-05-28 DIAGNOSIS — N390 Urinary tract infection, site not specified: Secondary | ICD-10-CM | POA: Diagnosis not present

## 2018-05-28 DIAGNOSIS — I252 Old myocardial infarction: Secondary | ICD-10-CM | POA: Diagnosis not present

## 2018-05-28 DIAGNOSIS — F312 Bipolar disorder, current episode manic severe with psychotic features: Secondary | ICD-10-CM | POA: Diagnosis not present

## 2018-05-28 DIAGNOSIS — N400 Enlarged prostate without lower urinary tract symptoms: Secondary | ICD-10-CM | POA: Diagnosis not present

## 2018-05-28 DIAGNOSIS — E538 Deficiency of other specified B group vitamins: Secondary | ICD-10-CM | POA: Diagnosis not present

## 2018-05-28 DIAGNOSIS — N200 Calculus of kidney: Secondary | ICD-10-CM | POA: Diagnosis not present

## 2018-05-28 DIAGNOSIS — K219 Gastro-esophageal reflux disease without esophagitis: Secondary | ICD-10-CM | POA: Diagnosis not present

## 2018-05-28 DIAGNOSIS — E785 Hyperlipidemia, unspecified: Secondary | ICD-10-CM | POA: Diagnosis not present

## 2018-05-28 DIAGNOSIS — E039 Hypothyroidism, unspecified: Secondary | ICD-10-CM | POA: Diagnosis not present

## 2018-06-26 DIAGNOSIS — Z0000 Encounter for general adult medical examination without abnormal findings: Secondary | ICD-10-CM | POA: Diagnosis not present

## 2018-06-26 DIAGNOSIS — N390 Urinary tract infection, site not specified: Secondary | ICD-10-CM | POA: Diagnosis not present

## 2018-06-26 DIAGNOSIS — N411 Chronic prostatitis: Secondary | ICD-10-CM | POA: Diagnosis not present

## 2018-07-10 DIAGNOSIS — F319 Bipolar disorder, unspecified: Secondary | ICD-10-CM | POA: Diagnosis not present

## 2018-08-27 DIAGNOSIS — U071 COVID-19: Secondary | ICD-10-CM | POA: Diagnosis not present

## 2018-09-02 DIAGNOSIS — N390 Urinary tract infection, site not specified: Secondary | ICD-10-CM | POA: Diagnosis not present

## 2018-09-23 DIAGNOSIS — N390 Urinary tract infection, site not specified: Secondary | ICD-10-CM | POA: Diagnosis not present

## 2018-10-02 DIAGNOSIS — N390 Urinary tract infection, site not specified: Secondary | ICD-10-CM | POA: Diagnosis not present

## 2018-10-02 DIAGNOSIS — N410 Acute prostatitis: Secondary | ICD-10-CM | POA: Diagnosis not present

## 2018-10-21 DIAGNOSIS — N390 Urinary tract infection, site not specified: Secondary | ICD-10-CM | POA: Diagnosis not present

## 2019-11-22 IMAGING — CT CT HEAD W/O CM
3 series · 16 of 47 positions shown, 19 images · non-contrast
Comparison: None.

CLINICAL DATA: Altered level of consciousness.

EXAM:
CT HEAD WITHOUT CONTRAST
TECHNIQUE: Contiguous axial images were obtained from the base of the skull
through the vertex without intravenous contrast.

[Series 2: head wo · axial · 0.44mm/px · z∈[-161,-36]mm · 10 of 31 slices shown, 13 images]
[im 3/31  brain]
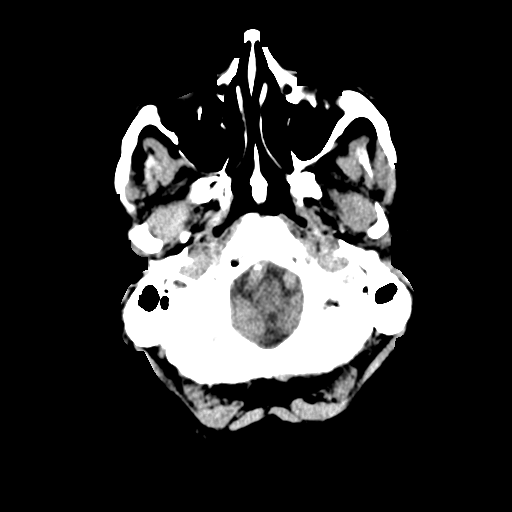
[im 3/31  bone]
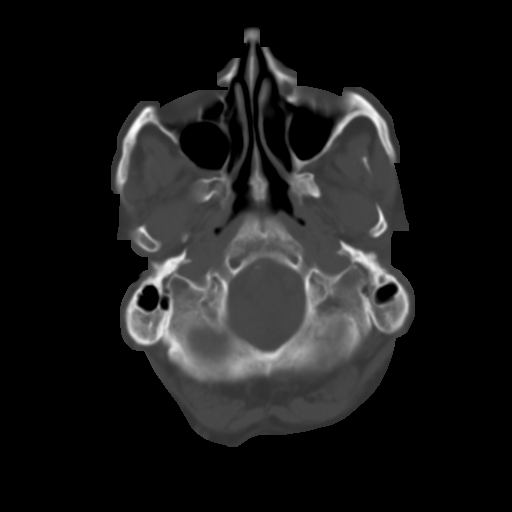
[im 6/31  brain]
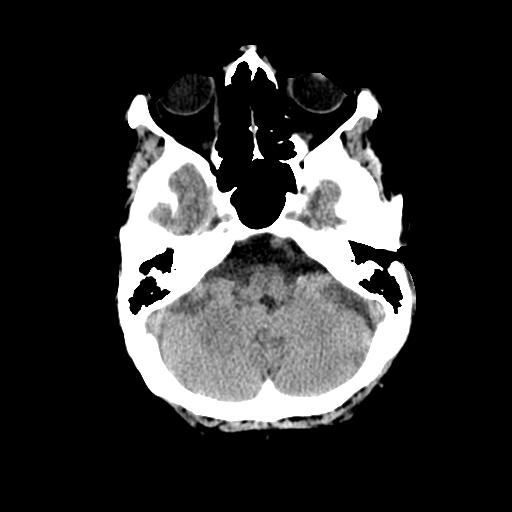
[im 9/31  brain]
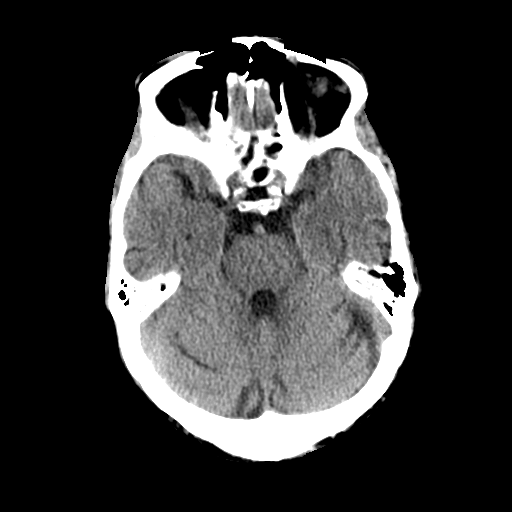
[im 11/31  brain]
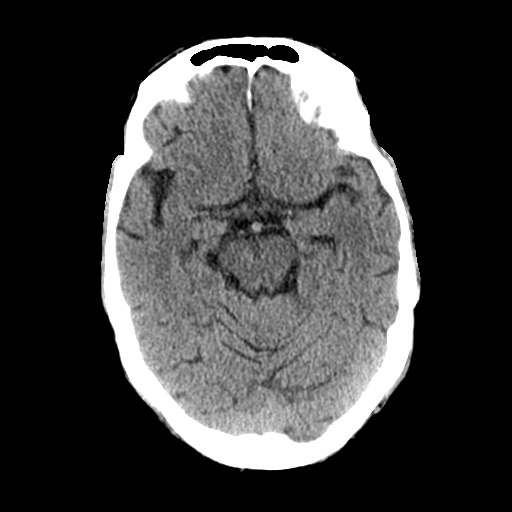
[im 14/31  brain]
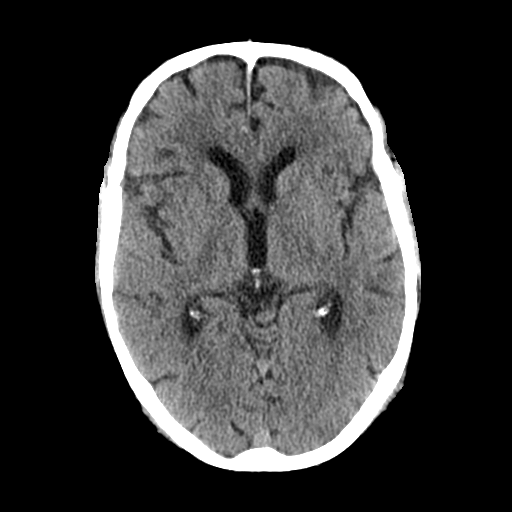
[im 14/31  bone]
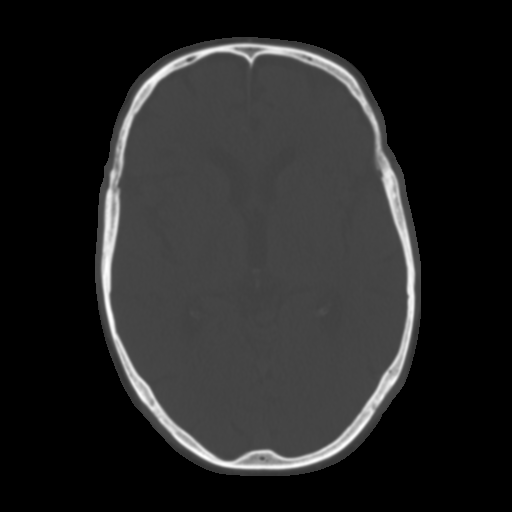
[im 17/31  brain]
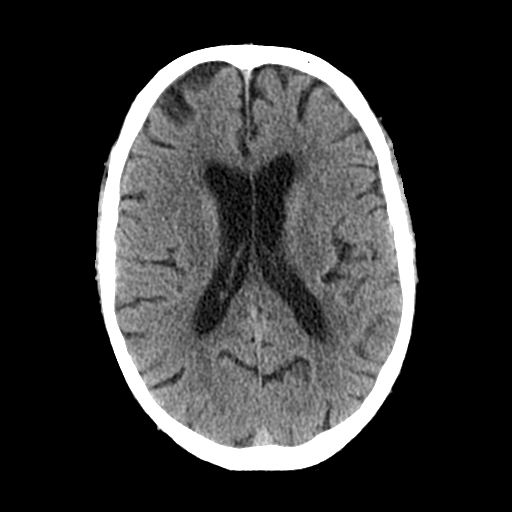
[im 20/31  brain]
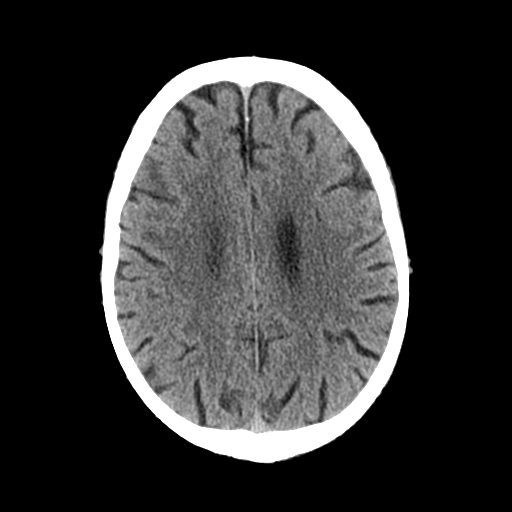
[im 23/31  brain]
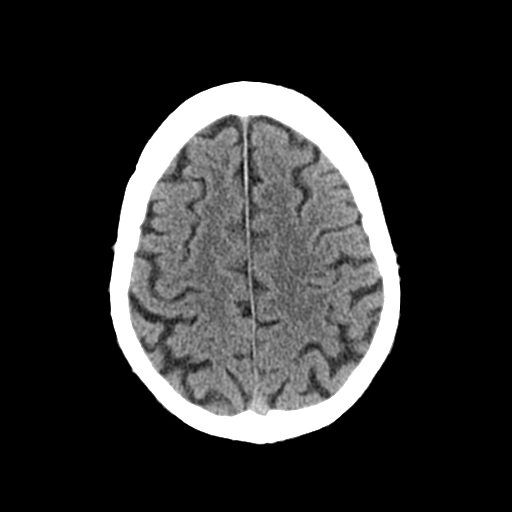
[im 25/31  brain]
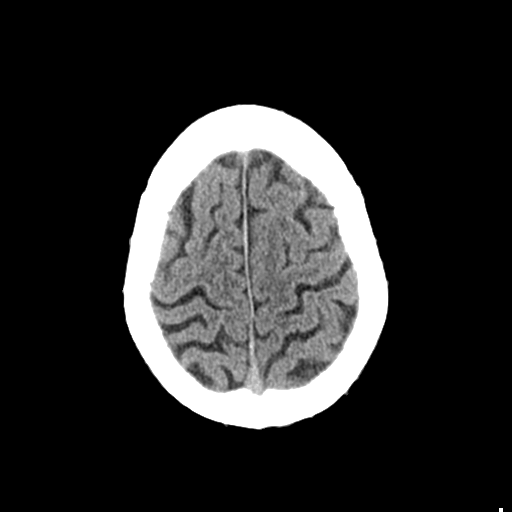
[im 25/31  bone]
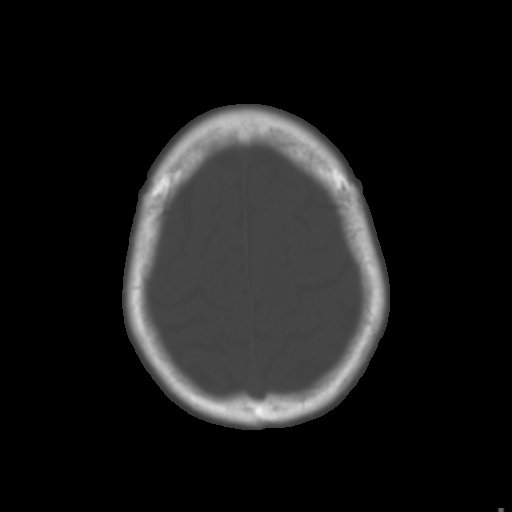
[im 28/31  brain]
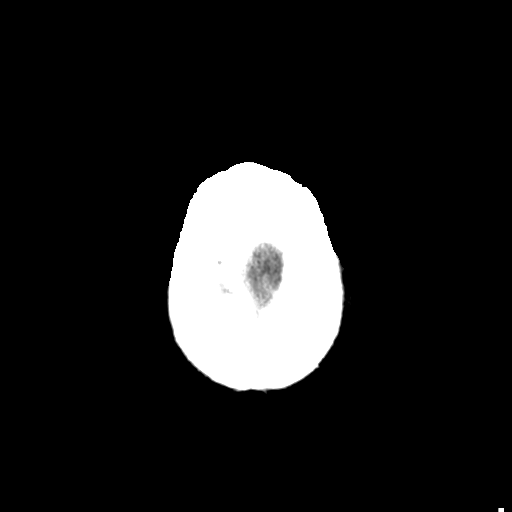

[Series 4: coronal soft tissue · coronal · 0.30mm/px · 3 of 65 slices shown]
[im 22/65  brain]
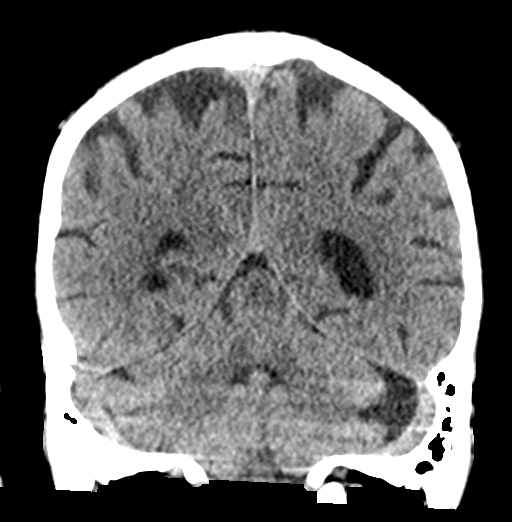
[im 29/65  brain]
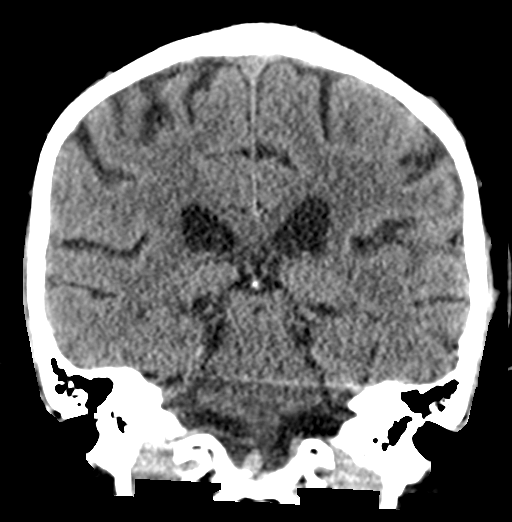
[im 36/65  brain]
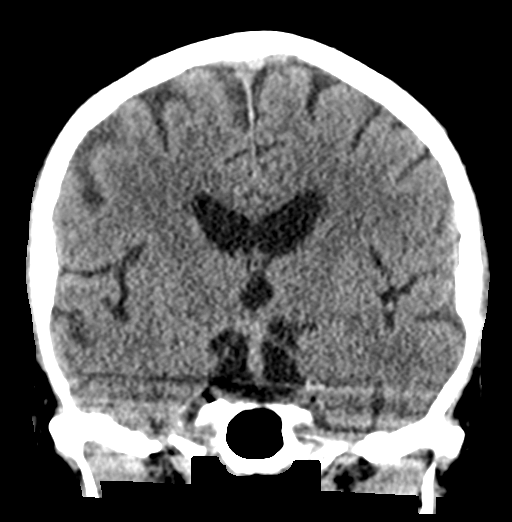

[Series 5: sagittal soft tissue · sagittal · 0.34mm/px · 3 of 51 slices shown]
[im 17/51  brain]
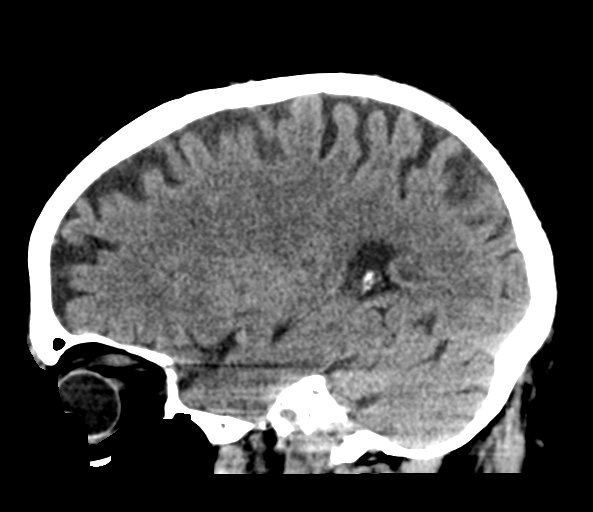
[im 26/51  brain]
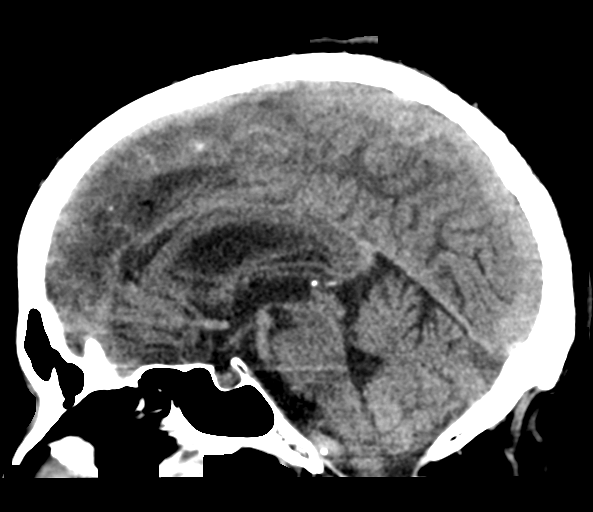
[im 34/51  brain]
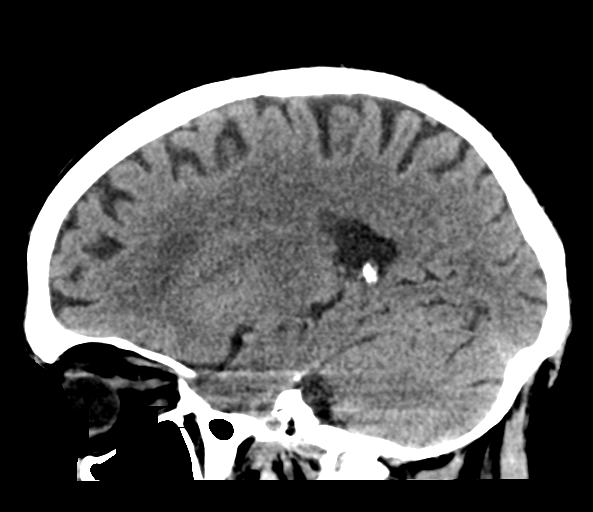

[16 of 47 positions shown; findings below may reference images not displayed]

FINDINGS: BRAIN: No intraparenchymal hemorrhage, mass effect nor midline
shift. No parenchymal brain volume loss for age. No hydrocephalus.
Minimal supratentorial white matter hypodensities less than expected
for patient's age, though non-specific are most compatible with
chronic small vessel ischemic disease. No acute large vascular
territory infarcts. No abnormal extra-axial fluid collections. Basal
cisterns are patent.

VASCULAR: Mild calcific atherosclerosis of the carotid siphons.

SKULL: No skull fracture. No significant scalp soft tissue swelling.

SINUSES/ORBITS: Trace paranasal sinus mucosal thickening. Mastoid
air cells are well aerated.The included ocular globes and orbital
contents are non-suspicious.

OTHER: None.
IMPRESSION: Negative non-contrast CT HEAD for age.
